# Patient Record
Sex: Male | Born: 1995 | Race: White | Hispanic: No | Marital: Single | Smoking: Never smoker
Health system: Southern US, Community
[De-identification: ages and names within clinical notes are randomized; demographics above are authoritative.]

## PROBLEM LIST (undated history)

## (undated) DIAGNOSIS — F70 Mild intellectual disabilities: Secondary | ICD-10-CM

## (undated) DIAGNOSIS — F909 Attention-deficit hyperactivity disorder, unspecified type: Secondary | ICD-10-CM

---
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
## 2016-03-17 ENCOUNTER — Ambulatory Visit (INDEPENDENT_AMBULATORY_CARE_PROVIDER_SITE_OTHER): Payer: Medicaid Other | Admitting: Family Medicine

## 2016-03-17 ENCOUNTER — Encounter: Payer: Self-pay | Admitting: Family Medicine

## 2016-03-17 VITALS — BP 124/84 | Ht 64.0 in | Wt 176.0 lb

## 2016-03-17 DIAGNOSIS — Z0000 Encounter for general adult medical examination without abnormal findings: Secondary | ICD-10-CM

## 2016-03-17 DIAGNOSIS — Q539 Undescended testicle, unspecified: Secondary | ICD-10-CM

## 2016-03-17 DIAGNOSIS — Q5310 Unspecified undescended testicle, unilateral: Secondary | ICD-10-CM

## 2016-03-17 MED ORDER — POLYETHYLENE GLYCOL 3350 17 GM/SCOOP PO POWD: ORAL | 5 refills | Status: AC

## 2016-03-17 NOTE — Progress Notes (Signed)
   Subjective:    Patient ID: Darryl Hickman, male    DOB: 1996-08-02, 20 y.o.   MRN: [id number]  HPI The patient comes in today for a wellness visit.  A review of their health history was completed.  A review of medications was also completed.  Any needed refills; none  Eating habits: eats healthy  Falls/  MVA accidents in past few months: none  Regular exercise: no regular exercise  Specialist pt sees on regular basis: none  Preventative health issues were discussed.   Additional concerns: constipation.  blood in stool one time. Stools are very hard in nature in recent months. At times sharp pain during a bowel movement slight blood at that time.  Living a group home. Accompanied by caretaker dense today    Review of Systems  Constitutional: Negative for activity change, appetite change and fever.  HENT: Negative for congestion and rhinorrhea.   Eyes: Negative for discharge.  Respiratory: Negative for cough and wheezing.   Cardiovascular: Negative for chest pain.  Gastrointestinal: Negative for abdominal pain, blood in stool and vomiting.  Genitourinary: Negative for difficulty urinating and frequency.  Musculoskeletal: Negative for neck pain.  Skin: Negative for rash.  Allergic/Immunologic: Negative for environmental allergies and food allergies.  Neurological: Negative for weakness and headaches.  Psychiatric/Behavioral: Negative for agitation.  All other systems reviewed and are negative.      Objective:   Physical Exam  Constitutional: He appears well-developed and well-nourished.  HENT:  Head: Normocephalic and atraumatic.  Right Ear: External ear normal.  Left Ear: External ear normal.  Nose: Nose normal.  Mouth/Throat: Oropharynx is clear and moist.  Eyes: EOM are normal. Pupils are equal, round, and reactive to light.  Neck: Normal range of motion. Neck supple. No thyromegaly present.  Cardiovascular: Normal rate, regular rhythm and normal heart sounds.    No murmur heard. Pulmonary/Chest: Effort normal and breath sounds normal. No respiratory distress. He has no wheezes.  Abdominal: Soft. Bowel sounds are normal. He exhibits no distension and no mass. There is no tenderness.  Genitourinary: Penis normal.  Musculoskeletal: Normal range of motion. He exhibits no edema.  Lymphadenopathy:    He has no cervical adenopathy.  Neurological: He is alert. He exhibits normal muscle tone.  Skin: Skin is warm and dry. No erythema.  Psychiatric: He has a normal mood and affect. His behavior is normal. Judgment normal.  Vitals reviewed.  Only one testicle on left side palpable.  Slight rectal fissure noted       Assessment & Plan:  Impression 1 wellness exam #2 developmental delay/low IQ #3 group home status with good support #4 constipation rectal fissures discussed #5 undescended testicle. This is a challenge. At times in the past patient was resistant and genital exams and this may be a long-term issue. Difficult to say. Plan no vaccines today. Diet exercise discussed. Add Muro lax. Urology referral rationale discussed with patient and caretaker

## 2016-03-21 ENCOUNTER — Encounter: Payer: Self-pay | Admitting: Family Medicine

## 2016-04-27 ENCOUNTER — Ambulatory Visit (INDEPENDENT_AMBULATORY_CARE_PROVIDER_SITE_OTHER): Payer: Medicaid Other | Admitting: Urology

## 2016-04-27 DIAGNOSIS — Q5310 Unspecified undescended testicle, unilateral: Secondary | ICD-10-CM

## 2016-05-03 ENCOUNTER — Other Ambulatory Visit: Payer: Self-pay | Admitting: Urology

## 2016-05-03 DIAGNOSIS — Q5310 Unspecified undescended testicle, unilateral: Secondary | ICD-10-CM

## 2016-05-06 ENCOUNTER — Encounter (HOSPITAL_COMMUNITY): Payer: Self-pay

## 2016-05-06 ENCOUNTER — Ambulatory Visit (HOSPITAL_COMMUNITY)
Admission: RE | Admit: 2016-05-06 | Discharge: 2016-05-06 | Disposition: A | Discharge status: Home / Self Care | Payer: Medicaid Other | Source: Ambulatory Visit | Attending: Urology | Admitting: Urology

## 2016-05-06 DIAGNOSIS — Q5310 Unspecified undescended testicle, unilateral: Secondary | ICD-10-CM

## 2016-05-10 ENCOUNTER — Telehealth: Payer: Self-pay | Admitting: Family Medicine

## 2016-05-10 NOTE — Telephone Encounter (Signed)
A form was dropped off to be filled out for the pt to participate in the special [place]. Form is in nurse box.

## 2016-05-25 ENCOUNTER — Ambulatory Visit (INDEPENDENT_AMBULATORY_CARE_PROVIDER_SITE_OTHER): Payer: Medicaid Other | Admitting: Urology

## 2016-05-25 DIAGNOSIS — Q5310 Unspecified undescended testicle, unilateral: Secondary | ICD-10-CM | POA: Diagnosis not present

## 2016-06-08 ENCOUNTER — Other Ambulatory Visit: Payer: Self-pay | Admitting: Radiology

## 2016-06-08 ENCOUNTER — Telehealth: Payer: Self-pay | Admitting: Radiology

## 2016-06-08 DIAGNOSIS — Q5310 Unspecified undescended testicle, unilateral: Secondary | ICD-10-CM

## 2016-06-08 NOTE — Telephone Encounter (Signed)
LMOM. Need to discuss surgery information. 

## 2016-06-13 NOTE — Telephone Encounter (Signed)
Notified pt's caregiver, Roma KayserVince Marley, of surgery scheduled with Dr Ronne BinningMcKenzie at [facility] on 06/22/16, pre-op appt at [facility] on 06/17/16 @10 :00 & post op appt with Dr Ronne BinningMcKenzie in [place] office on 07/06/16 @10 :45. Vince voices understanding.

## 2016-06-15 NOTE — Patient Instructions (Signed)
Janie MorningJoseph E Level  06/15/2016     @PREFPERIOPPHARMACY @   Your procedure is scheduled on  06/22/2016   [facility] at  1115  A.M.  Call this number if you have problems the morning of surgery:  [phone]   Remember:  Do not eat food or drink liquids after midnight.  Take these medicines the morning of surgery with A SIP OF WATER adderall, zyrtec, prednisone. Take your nebulizer before you come. Bring your inhaler with you.   Do not wear jewelry, make-up or nail polish.  Do not wear lotions, powders, or perfumes, or deoderant.  Do not shave 48 hours prior to surgery.  Men may shave face and neck.  Do not bring valuables to the hospital.  [facility] is not responsible for any belongings or valuables.  Contacts, dentures or bridgework may not be worn into surgery.  Leave your suitcase in the car.  After surgery it may be brought to your room.  For patients admitted to the hospital, discharge time will be determined by your treatment team.  Patients discharged the day of surgery will not be allowed to drive home.   Name and phone number of your driver:   family Special instructions:  none  Please read over the following fact sheets that you were given. Anesthesia Post-op Instructions and Care and Recovery After Surgery       Orchiectomy An orchiectomy is the removal of the testicles. It is most often done to treat cancer of the prostate. It is also done to treat cancer of the testicles. The testicles can be replaced with artificial testicles. LET [facility] KNOW ABOUT:  Any allergies you have.  All medicines you are taking, including vitamins, herbs, eye drops, creams, and over-the-counter medicines.  Previous problems you or members of your family have had with the use of anesthetics.  Any blood disorders you have.  Previous surgeries you have had.  Medical conditions you have. RISKS AND COMPLICATIONS Generally,  orchiectomy is a safe procedure. However, as with any procedure, problems can occur. Possible problems include:  Infection of the surgical site.  Bleeding inside the sac that holds your testicles (scrotum). This is called a scrotal hematoma.  Discharge from the surgical site. BEFORE THE PROCEDURE  You may be asked to wash your genital area with sterile soap the morning of your procedure.  You may be given an oral antibiotic medicine, which you should take with a sip of water as prescribed by your physician.  You will generally not be allowed to eat or drink anything for 8 hours prior to your surgery. Ask your health care provider if it is okay to take any needed medicines with a sip of water. PROCEDURE  This surgery is done with the use of a local anesthetic. Sometimes a general anesthetic or light sedation may be used and you may be sleeping during the procedure. If your procedure is indicated for treatment of prostate cancer, the incision will be in the scrotum. If your procedure is for testicular cancer, the incision will be in the groin.After the removal, the incision will be closed. A sterile dressing will be applied to the incision site. You may have a scrotal support. This elevates the scrotum, thereby relieving pressure on the surgical site.  AFTER THE PROCEDURE After the procedure, you will be taken to the recovery area, where a nurse will watch you and check  your progress. Once you are awake, stable, and taking fluids well, without other problems, you will be allowed to go home. In those cases where your scrotal support irritates your incision site, you may remove the support. It is okay if the dressing comes off, especially at night. Air will help a scab to form, which will eliminate the need for dressings during the day.   This information is not intended to replace advice given to you by your health care provider. Make sure you discuss any questions you have with your health care  provider.   Document Released: 07/01/2005 Document Revised: 08/06/2013 Document Reviewed: 01/02/2013 Elsevier Interactive Patient Education 2016 Elsevier [employer] Orchiectomy, Care After Refer to this sheet in the next few weeks. These instructions provide you with information on caring for yourself after your procedure. Your health care provider may also give you more specific instructions. Your treatment has been planned according to current medical practices, but problems sometimes occur. Call your health care provider if you have any problems or questions after your procedure. WHAT TO EXPECT AFTER THE PROCEDURE A sterile dressing will be applied to the incision site. You may have a scrotal support. This elevates the scrotum, thereby relieving pressure on the surgical site. In those cases where the scrotal support irritates the incision site, you may be better with the support removed. It is okay if the dressing comes off, especially at night. Air will help a scab to form, which will eliminate the need for dressings during the day. HOME CARE INSTRUCTIONS  Your sterile dressing may be changed once per day or as instructed by your health care provider. If the dressing sticks to your incision site, you may use warm, soapy water or hydrogen peroxide to dampen the bandage. This will loosen the bandage from your skin so that it may be removed.  You may take showers the day after your procedure. Let the water pass gently over the surgery site. Do not rub the site. Pat the area gently or allow to air dry.  Avoid activities that may cause your incision to open.  Do not engage in sexual activity until the area is healed. Usually this will be in about 10-14 days.  Only take over-the-counter or prescription medicines for pain, discomfort, or fever as directed by your health care provider. SEEK MEDICAL CARE IF:  You experience increasing pain. SEEK IMMEDIATE MEDICAL CARE IF:  You have persistent dizziness  or feel sick to your stomach (nausea).  You have difficulty staying awake or are unable to wake from sleeping.  You have difficulty breathing or a congested cough.  You have a fever or shaking chills.  You have increasing pain or tenderness at the incision site.  You notice pus coming from the incision.  You notice a bad smell coming from the incision or dressing.  You cannot eat or drink or you develop nausea or vomiting.  You have constipation that is not helped by adjusting your diet or increasing fluid intake. Pain medicines are a common cause of constipation.  Your incision breaks open after the sutures have been removed.  You experience pain, swelling, or redness in your genital or groin area.   This information is not intended to replace advice given to you by your health care provider. Make sure you discuss any questions you have with your health care provider.   Document Released: 04/03/2013 Document Revised: 08/06/2013 Document Reviewed: 04/03/2013 Elsevier Interactive Patient Education 2016 Elsevier Inc. General Anesthesia, Adult General anesthesia  is a sleep-like state of non-feeling produced by medicines (anesthetics). General anesthesia prevents you from being alert and feeling pain during a medical procedure. Your caregiver may recommend general anesthesia if your procedure:  Is long.  Is painful or uncomfortable.  Would be frightening to see or hear.  Requires you to be still.  Affects your breathing.  Causes significant blood loss. LET YOUR CAREGIVER KNOW ABOUT:  Allergies to food or medicine.  Medicines taken, including vitamins, herbs, eyedrops, over-the-counter medicines, and creams.  Use of steroids (by mouth or creams).  Previous problems with anesthetics or numbing medicines, including problems experienced by relatives.  History of bleeding problems or blood clots.  Previous surgeries and types of anesthetics received.  Possibility of  pregnancy, if this applies.  Use of cigarettes, alcohol, or illegal drugs.  Any health condition(s), especially diabetes, sleep apnea, and high blood pressure. RISKS AND COMPLICATIONS General anesthesia rarely causes complications. However, if complications do occur, they can be life threatening. Complications include:  A lung infection.  A stroke.  A heart attack.  Waking up during the procedure. When this occurs, the patient may be unable to move and communicate that he or she is awake. The patient may feel severe pain. Older adults and adults with serious medical problems are more likely to have complications than adults who are young and healthy. Some complications can be prevented by answering all of your caregiver's questions thoroughly and by following all pre-procedure instructions. It is important to tell your caregiver if any of the pre-procedure instructions, especially those related to diet, were not followed. Any food or liquid in the stomach can cause problems when you are under general anesthesia. BEFORE THE PROCEDURE  Ask your caregiver if you will have to spend the night at the hospital. If you will not have to spend the night, arrange to have an adult drive you and stay with you for 24 hours.  Follow your caregiver's instructions if you are taking dietary supplements or medicines. Your caregiver may tell you to stop taking them or to reduce your dosage.  Do not smoke for as long as possible before your procedure. If possible, stop smoking 3-6 weeks before the procedure.  Do not take new dietary supplements or medicines within 1 week of your procedure unless your caregiver approves them.  Do not eat within 8 hours of your procedure or as directed by your caregiver. Drink only clear liquids, such as water, black coffee (without milk or cream), and fruit juices (without pulp).  Do not drink within 3 hours of your procedure or as directed by your caregiver.  You may brush  your teeth on the morning of the procedure, but make sure to spit out the toothpaste and water when finished. PROCEDURE  You will receive anesthetics through a mask, through an intravenous (IV) access tube, or through both. A doctor who specializes in anesthesia (anesthesiologist) or a nurse who specializes in anesthesia (nurse anesthetist) or both will stay with you throughout the procedure to make sure you remain unconscious. He or she will also watch your blood pressure, pulse, and oxygen levels to make sure that the anesthetics do not cause any problems. Once you are asleep, a breathing tube or mask may be used to help you breathe. AFTER THE PROCEDURE You will wake up after the procedure is complete. You may be in the room where the procedure was performed or in a recovery area. You may have a sore throat if a breathing tube  was used. You may also feel:  Dizzy.  Weak.  Drowsy.  Confused.  Nauseous.  Cold. These are all normal responses and can be expected to last for up to 24 hours after the procedure is complete. A caregiver will tell you when you are ready to go home. This will usually be when you are fully awake and in stable condition.   This information is not intended to replace advice given to you by your health care provider. Make sure you discuss any questions you have with your health care provider.   Document Released: 11/08/2007 Document Revised: 08/22/2014 Document Reviewed: 11/30/2011 Elsevier Interactive Patient Education 2016 Elsevier Inc. General Anesthesia, Adult, Care After Refer to this sheet in the next few weeks. These instructions provide you with information on caring for yourself after your procedure. Your health care provider may also give you more specific instructions. Your treatment has been planned according to current medical practices, but problems sometimes occur. Call your health care provider if you have any problems or questions after your  procedure. WHAT TO EXPECT AFTER THE PROCEDURE After the procedure, it is typical to experience:  Sleepiness.  Nausea and vomiting. HOME CARE INSTRUCTIONS  For the first 24 hours after general anesthesia:  Have a responsible person with you.  Do not drive a car. If you are alone, do not take public transportation.  Do not drink alcohol.  Do not take medicine that has not been prescribed by your health care provider.  Do not sign important papers or make important decisions.  You may resume a normal diet and activities as directed by your health care provider.  Change bandages (dressings) as directed.  If you have questions or problems that seem related to general anesthesia, call the hospital and ask for the anesthetist or anesthesiologist on call. SEEK MEDICAL CARE IF:  You have nausea and vomiting that continue the day after anesthesia.  You develop a rash. SEEK IMMEDIATE MEDICAL CARE IF:   You have difficulty breathing.  You have chest pain.  You have any allergic problems.   This information is not intended to replace advice given to you by your health care provider. Make sure you discuss any questions you have with your health care provider.   Document Released: 11/07/2000 Document Revised: 08/22/2014 Document Reviewed: 11/30/2011 Elsevier Interactive Patient Education 2016 Elsevier Inc. PATIENT INSTRUCTIONS POST-ANESTHESIA  IMMEDIATELY FOLLOWING SURGERY:  Do not drive or operate machinery for the first twenty four hours after surgery.  Do not make any important decisions for twenty four hours after surgery or while taking narcotic pain medications or sedatives.  If you develop intractable nausea and vomiting or a severe headache please notify your doctor immediately.  FOLLOW-UP:  Please make an appointment with your surgeon as instructed. You do not need to follow up with anesthesia unless specifically instructed to do so.  WOUND CARE INSTRUCTIONS (if  applicable):  Keep a dry clean dressing on the anesthesia/puncture wound site if there is drainage.  Once the wound has quit draining you may leave it open to air.  Generally you should leave the bandage intact for twenty four hours unless there is drainage.  If the epidural site drains for more than 36-48 hours please call the anesthesia department.  QUESTIONS?:  Please feel free to call your physician or the hospital operator if you have any questions, and they will be happy to assist you.

## 2016-06-17 ENCOUNTER — Encounter (HOSPITAL_COMMUNITY)
Admission: RE | Admit: 2016-06-17 | Discharge: 2016-06-17 | Disposition: A | Discharge status: Home / Self Care | Payer: Medicaid Other | Source: Ambulatory Visit | Attending: Urology | Admitting: Urology

## 2016-06-17 ENCOUNTER — Encounter (HOSPITAL_COMMUNITY): Payer: Self-pay

## 2016-06-17 DIAGNOSIS — Z01818 Encounter for other preprocedural examination: Secondary | ICD-10-CM | POA: Diagnosis present

## 2016-06-17 HISTORY — DX: Attention-deficit hyperactivity disorder, unspecified type: F90.9

## 2016-06-17 HISTORY — DX: Mild intellectual disabilities: F70

## 2016-06-17 LAB — CBC WITH DIFFERENTIAL/PLATELET
BASOS PCT: 1 %
Basophils Absolute: 0.1 10*3/uL (ref 0.0–0.1)
EOS ABS: 0.2 10*3/uL (ref 0.0–0.7)
Eosinophils Relative: 2 %
HEMATOCRIT: 43.7 % (ref 39.0–52.0)
Hemoglobin: 14.3 g/dL (ref 13.0–17.0)
Lymphocytes Relative: 30 %
Lymphs Abs: 2.4 10*3/uL (ref 0.7–4.0)
MCH: 28.8 pg (ref 26.0–34.0)
MCHC: 32.7 g/dL (ref 30.0–36.0)
MCV: 87.9 fL (ref 78.0–100.0)
MONO ABS: 0.7 10*3/uL (ref 0.1–1.0)
MONOS PCT: 9 %
Neutro Abs: 4.5 10*3/uL (ref 1.7–7.7)
Neutrophils Relative %: 58 %
Platelets: 186 10*3/uL (ref 150–400)
RBC: 4.97 MIL/uL (ref 4.22–5.81)
RDW: 13.1 % (ref 11.5–15.5)
WBC: 7.8 10*3/uL (ref 4.0–10.5)

## 2016-06-22 ENCOUNTER — Encounter (HOSPITAL_COMMUNITY): Payer: Self-pay | Admitting: *Deleted

## 2016-06-22 ENCOUNTER — Encounter (HOSPITAL_COMMUNITY)
Admission: RE | Disposition: A | Discharge status: Home / Self Care | Payer: Self-pay | Source: Ambulatory Visit | Attending: Urology

## 2016-06-22 ENCOUNTER — Ambulatory Visit (HOSPITAL_COMMUNITY): Payer: Medicaid Other | Admitting: Anesthesiology

## 2016-06-22 ENCOUNTER — Ambulatory Visit (HOSPITAL_COMMUNITY)
Admission: RE | Admit: 2016-06-22 | Discharge: 2016-06-22 | Disposition: A | Discharge status: Home / Self Care | Payer: Medicaid Other | Source: Ambulatory Visit | Attending: Urology | Admitting: Urology

## 2016-06-22 DIAGNOSIS — F909 Attention-deficit hyperactivity disorder, unspecified type: Secondary | ICD-10-CM | POA: Insufficient documentation

## 2016-06-22 DIAGNOSIS — Q5310 Unspecified undescended testicle, unilateral: Secondary | ICD-10-CM

## 2016-06-22 DIAGNOSIS — F70 Mild intellectual disabilities: Secondary | ICD-10-CM | POA: Diagnosis not present

## 2016-06-22 DIAGNOSIS — J45909 Unspecified asthma, uncomplicated: Secondary | ICD-10-CM | POA: Diagnosis not present

## 2016-06-22 DIAGNOSIS — Z79899 Other long term (current) drug therapy: Secondary | ICD-10-CM | POA: Diagnosis not present

## 2016-06-22 HISTORY — PX: ORCHIECTOMY: SHX2116

## 2016-06-22 LAB — GLUCOSE, CAPILLARY
GLUCOSE-CAPILLARY: 83 mg/dL (ref 65–99)
Glucose-Capillary: 63 mg/dL — ABNORMAL LOW (ref 65–99)

## 2016-06-22 SURGERY — ORCHIECTOMY
Anesthesia: General | Laterality: Right

## 2016-06-22 MED ORDER — ONDANSETRON HCL 4 MG/2ML IJ SOLN
4.0000 mg | Freq: Once | INTRAMUSCULAR | Status: AC
  Administered 2016-06-22: 4 mg via INTRAVENOUS

## 2016-06-22 MED ORDER — LIDOCAINE HCL (PF) 1 % IJ SOLN
INTRAMUSCULAR | Status: AC
  Filled 2016-06-22: qty 5

## 2016-06-22 MED ORDER — HYDROCODONE-ACETAMINOPHEN 5-325 MG PO TABS
ORAL_TABLET | ORAL | Status: AC
  Filled 2016-06-22: qty 1

## 2016-06-22 MED ORDER — FENTANYL CITRATE (PF) 100 MCG/2ML IJ SOLN
INTRAMUSCULAR | Status: AC
  Filled 2016-06-22: qty 2

## 2016-06-22 MED ORDER — FENTANYL CITRATE (PF) 250 MCG/5ML IJ SOLN
INTRAMUSCULAR | Status: AC
  Filled 2016-06-22: qty 5

## 2016-06-22 MED ORDER — PROPOFOL 10 MG/ML IV BOLUS
INTRAVENOUS | Status: DC | PRN
  Administered 2016-06-22: 140 mg via INTRAVENOUS

## 2016-06-22 MED ORDER — LIDOCAINE HCL 1 % IJ SOLN
INTRAMUSCULAR | Status: DC | PRN
  Administered 2016-06-22: 25 mg via INTRADERMAL

## 2016-06-22 MED ORDER — CEFAZOLIN IN D5W 1 GM/50ML IV SOLN
INTRAVENOUS | Status: AC
Start: 2016-06-22 — End: 2016-06-22
  Filled 2016-06-22: qty 50

## 2016-06-22 MED ORDER — FENTANYL CITRATE (PF) 100 MCG/2ML IJ SOLN
INTRAMUSCULAR | Status: DC | PRN
  Administered 2016-06-22 (×5): 25 ug via INTRAVENOUS
  Administered 2016-06-22: 50 ug via INTRAVENOUS
  Administered 2016-06-22: 25 ug via INTRAVENOUS

## 2016-06-22 MED ORDER — BUPIVACAINE HCL (PF) 0.25 % IJ SOLN
INTRAMUSCULAR | Status: AC
Start: 2016-06-22 — End: 2016-06-22
  Filled 2016-06-22: qty 30

## 2016-06-22 MED ORDER — CEFAZOLIN IN D5W 1 GM/50ML IV SOLN
1.0000 g | INTRAVENOUS | Status: AC
  Administered 2016-06-22: 1 g via INTRAVENOUS

## 2016-06-22 MED ORDER — FENTANYL CITRATE (PF) 100 MCG/2ML IJ SOLN
25.0000 ug | INTRAMUSCULAR | Status: DC | PRN
  Administered 2016-06-22 (×2): 50 ug via INTRAVENOUS

## 2016-06-22 MED ORDER — LACTATED RINGERS IV SOLN
INTRAVENOUS | Status: DC
  Administered 2016-06-22: 12:00:00 via INTRAVENOUS

## 2016-06-22 MED ORDER — ONDANSETRON HCL 4 MG/2ML IJ SOLN
INTRAMUSCULAR | Status: AC
  Filled 2016-06-22: qty 2

## 2016-06-22 MED ORDER — MIDAZOLAM HCL 5 MG/5ML IJ SOLN
INTRAMUSCULAR | Status: DC | PRN
  Administered 2016-06-22: 2 mg via INTRAVENOUS

## 2016-06-22 MED ORDER — MIDAZOLAM HCL 2 MG/2ML IJ SOLN
1.0000 mg | INTRAMUSCULAR | Status: DC | PRN
  Administered 2016-06-22: 2 mg via INTRAVENOUS

## 2016-06-22 MED ORDER — PROPOFOL 10 MG/ML IV BOLUS
INTRAVENOUS | Status: AC
  Filled 2016-06-22: qty 20

## 2016-06-22 MED ORDER — DEXTROSE 50 % IV SOLN
INTRAVENOUS | Status: AC
  Filled 2016-06-22: qty 50

## 2016-06-22 MED ORDER — DEXTROSE 50 % IV SOLN
25.0000 mL | Freq: Once | INTRAVENOUS | Status: AC
  Administered 2016-06-22: 25 mL via INTRAVENOUS

## 2016-06-22 MED ORDER — HYDROCODONE-ACETAMINOPHEN 5-325 MG PO TABS: 1.0000 | ORAL_TABLET | Freq: Four times a day (QID) | ORAL | 0 refills | Status: DC | PRN

## 2016-06-22 MED ORDER — BUPIVACAINE HCL (PF) 0.25 % IJ SOLN
INTRAMUSCULAR | Status: DC | PRN
  Administered 2016-06-22: 10 mL

## 2016-06-22 MED ORDER — 0.9 % SODIUM CHLORIDE (POUR BTL) OPTIME
TOPICAL | Status: DC | PRN
  Administered 2016-06-22: 1000 mL

## 2016-06-22 MED ORDER — MIDAZOLAM HCL 2 MG/2ML IJ SOLN
INTRAMUSCULAR | Status: AC
  Filled 2016-06-22: qty 2

## 2016-06-22 MED ORDER — HYDROCODONE-ACETAMINOPHEN 5-325 MG PO TABS
1.0000 | ORAL_TABLET | Freq: Once | ORAL | Status: AC
  Administered 2016-06-22: 1 via ORAL

## 2016-06-22 MED ORDER — MIDAZOLAM HCL 2 MG/2ML IJ SOLN
INTRAMUSCULAR | Status: AC
Start: 2016-06-22 — End: 2016-06-22
  Filled 2016-06-22: qty 2

## 2016-06-22 MED ORDER — PROPOFOL 10 MG/ML IV BOLUS
INTRAVENOUS | Status: AC
  Filled 2016-06-22: qty 40

## 2016-06-22 SURGICAL SUPPLY — 52 items
ADH SKN CLS APL DERMABOND .7 (GAUZE/BANDAGES/DRESSINGS) ×1
APPLICATOR COTTON TIP 6IN STRL (MISCELLANEOUS) IMPLANT
BAG HAMPER (MISCELLANEOUS) ×3 IMPLANT
COVER LIGHT HANDLE STERIS (MISCELLANEOUS) ×6 IMPLANT
DERMABOND ADVANCED (GAUZE/BANDAGES/DRESSINGS) ×2
DERMABOND ADVANCED .7 DNX12 (GAUZE/BANDAGES/DRESSINGS) IMPLANT
DRAIN PENROSE 18X1/2 LTX STRL (DRAIN) ×3 IMPLANT
ELECT NDL TIP 2.8 STRL (NEEDLE) IMPLANT
ELECT NEEDLE TIP 2.8 STRL (NEEDLE) IMPLANT
ELECT REM PT RETURN 9FT ADLT (ELECTROSURGICAL) ×3
ELECTRODE REM PT RTRN 9FT ADLT (ELECTROSURGICAL) ×1 IMPLANT
GLOVE BIO SURGEON STRL SZ8 (GLOVE) ×3 IMPLANT
GLOVE BIOGEL PI IND STRL 8.5 (GLOVE) ×1 IMPLANT
GLOVE BIOGEL PI INDICATOR 8.5 (GLOVE) ×2
GLOVE ECLIPSE 6.5 STRL STRAW (GLOVE) ×2 IMPLANT
GLOVE EXAM NITRILE PF MED BLUE (GLOVE) ×2 IMPLANT
GOWN STRL REUS W/ TWL LRG LVL3 (GOWN DISPOSABLE) ×1 IMPLANT
GOWN STRL REUS W/ TWL XL LVL3 (GOWN DISPOSABLE) ×1 IMPLANT
GOWN STRL REUS W/TWL LRG LVL3 (GOWN DISPOSABLE) ×3
GOWN STRL REUS W/TWL XL LVL3 (GOWN DISPOSABLE) ×3
KIT ROOM TURNOVER APOR (KITS) ×3 IMPLANT
LIQUID BAND (GAUZE/BANDAGES/DRESSINGS) IMPLANT
MANIFOLD NEPTUNE II (INSTRUMENTS) ×2 IMPLANT
NDL HYPO 25X1 1.5 SAFETY (NEEDLE) ×1 IMPLANT
NEEDLE HYPO 25X1 1.5 SAFETY (NEEDLE) ×3 IMPLANT
NS IRRIG 500ML POUR BTL (IV SOLUTION) IMPLANT
PACK MINOR (CUSTOM PROCEDURE TRAY) ×3 IMPLANT
PAD ARMBOARD 7.5X6 YLW CONV (MISCELLANEOUS) ×3 IMPLANT
SET BASIN LINEN APH (SET/KITS/TRAYS/PACK) ×3 IMPLANT
SUPPORT SCROTAL LG STRP (MISCELLANEOUS) ×2 IMPLANT
SUPPORTER ATHLETIC LG (MISCELLANEOUS) ×1
SUT CHROMIC 3 0 SH 27 (SUTURE) IMPLANT
SUT CHROMIC 4 0 PS 2 18 (SUTURE) IMPLANT
SUT CHROMIC GUT AB #0 18 (SUTURE) IMPLANT
SUT MNCRL AB 4-0 PS2 18 (SUTURE) ×2 IMPLANT
SUT PROLENE 3 0 PS 2 (SUTURE) ×2 IMPLANT
SUT PROLENE 4 0 RB 1 (SUTURE)
SUT PROLENE 4-0 RB1 .5 CRCL 36 (SUTURE) IMPLANT
SUT SILK 0 SH 30 (SUTURE) IMPLANT
SUT SILK 2 0 (SUTURE) ×3
SUT SILK 2 0 SH (SUTURE) ×2 IMPLANT
SUT SILK 2-0 18XBRD TIE 12 (SUTURE) IMPLANT
SUT VIC AB 0 SH 27 (SUTURE) IMPLANT
SUT VIC AB 2-0 SH 27 (SUTURE) ×3
SUT VIC AB 2-0 SH 27X BRD (SUTURE) ×1 IMPLANT
SUT VIC AB 2-0 SH 27XBRD (SUTURE) IMPLANT
SUT VIC AB 3-0 SH 27 (SUTURE) ×3
SUT VIC AB 3-0 SH 27X BRD (SUTURE) IMPLANT
SUT VICRYL 2 0 18  UND BR (SUTURE)
SUT VICRYL 2 0 18 UND BR (SUTURE) IMPLANT
SYR 30ML LL (SYRINGE) IMPLANT
SYR CONTROL 10ML LL (SYRINGE) ×3 IMPLANT

## 2016-06-22 NOTE — H&P (Signed)
Urology Admission H&P  Chief Complaint: right undescended tesis  History of Present Illness: Mr Darryl Hickman is a 20yo with a right atrophic undescended testis.   Past Medical History:  Diagnosis Date  . ADHD   . Asthma   . Mental retardation, mild (I.Q. 50-70)    History reviewed. No pertinent surgical history.  Home Medications:  Prescriptions Prior to Admission  Medication Sig Dispense Refill Last Dose  . albuterol (PROVENTIL HFA;VENTOLIN HFA) 108 (90 BASE) MCG/ACT inhaler Inhale 2 puffs into the lungs every 4 (four) hours as needed for wheezing or shortness of breath. 1 Inhaler 0   . albuterol (PROVENTIL) (2.5 MG/3ML) 0.083% nebulizer solution Take 2.5 mg by nebulization every 6 (six) hours as needed.     Taking  . Amphetamine-Dextroamphetamine (ADDERALL PO) Take by mouth 2 (two) times daily. I tablet Twice daily. 10 mg in the A.M. &  10 mg @@ Noon     . cetirizine (ZYRTEC) 10 MG tablet Take 1 tablet (10 mg total) by mouth daily. 30 tablet 11 Taking  . polyethylene glycol powder (GLYCOLAX/MIRALAX) powder One scoop daily 3350 g 5   . predniSONE (DELTASONE) 20 MG tablet Three qd for three d two qd for three d one qd for two d 17 tablet 0   . triamcinolone cream (KENALOG) 0.1 % Apply 1 application topically 2 (two) times daily. 60 g 0 Taking  . triamcinolone cream (KENALOG) 0.1 % Apply 1 application topically 2 (two) times daily. 60 g 1    Allergies:  Allergies  Allergen Reactions  . Sulfa Antibiotics   . Dimetapp Cold-Allergy [Brompheniramine-Phenylephrine] Itching    Family History  Problem Relation Age of Onset  . Anemia Mother   . Diabetes Mother    Social History:  reports that he has never smoked. He has never used smokeless tobacco. He reports that he does not drink alcohol or use drugs.  Review of Systems  All other systems reviewed and are negative.   Physical Exam:  Vital signs in last 24 hours: Temp:  [98.3 F (36.8 C)] 98.3 F (36.8 C) (11/08 1210) Resp:   [16-36] 18 (11/08 1225) BP: (118-125)/(66-70) 125/67 (11/08 1225) SpO2:  [98 %-100 %] 98 % (11/08 1225) Physical Exam  Constitutional: He is oriented to person, place, and time. He appears well-developed and well-nourished.  HENT:  Head: Normocephalic and atraumatic.  Eyes: EOM are normal. Pupils are equal, round, and reactive to light.  Neck: Normal range of motion. No thyromegaly present.  Cardiovascular: Normal rate and regular rhythm.   Respiratory: Effort normal. No respiratory distress.  GI: Soft. He exhibits no distension. Hernia confirmed negative in the right inguinal area and confirmed negative in the left inguinal area.  Genitourinary: Penis normal. Right testis is undescended. Left testis shows no mass, no swelling and no tenderness. Left testis is descended. Cremasteric reflex is not absent on the left side.  Musculoskeletal: Normal range of motion.  Lymphadenopathy:       Right: No inguinal adenopathy present.       Left: No inguinal adenopathy present.  Neurological: He is alert and oriented to person, place, and time.  Skin: Skin is warm and dry.  Psychiatric: He has a normal mood and affect. His behavior is normal. Judgment and thought content normal.    Laboratory Data:  Results for orders placed or performed during the hospital encounter of 06/22/16 (from the past 24 hour(s))  Glucose, capillary     Status: Abnormal   Collection Time:  06/22/16 11:49 AM  Result Value Ref Range   Glucose-Capillary 63 (L) 65 - 99 mg/dL   No results found for this or any previous visit (from the past 240 hour(s)). Creatinine: No results for input(s): CREATININE in the last 168 hours. Baseline Creatinine: unknown  Impression/Assessment:  20yo with right undescended testis  Plan:  The risks/benefits/alternatives to right inguinal orchiectomy was explained to the parents and they understand and wish to proceed with surgery  Wilkie Ayeatrick Aydrien Froman 06/22/2016, 12:53 PM

## 2016-06-22 NOTE — Anesthesia Preprocedure Evaluation (Signed)
Anesthesia Evaluation  Patient identified by MRN, date of birth, ID band Patient awake    Reviewed: Allergy & Precautions, NPO status , Patient's Chart, lab work & pertinent test results  Airway Mallampati: III  TM Distance: >3 FB Neck ROM: Limited    Dental  (+) Teeth Intact   Pulmonary asthma ,    breath sounds clear to auscultation       Cardiovascular negative cardio ROS   Rhythm:Regular Rate:Normal     Neuro/Psych PSYCHIATRIC DISORDERS (ADHD, mild mental retardation)    GI/Hepatic negative GI ROS,   Endo/Other    Renal/GU      Musculoskeletal   Abdominal   Peds  Hematology   Anesthesia Other Findings   Reproductive/Obstetrics                             Anesthesia Physical Anesthesia Plan  ASA: II  Anesthesia Plan: General   Post-op Pain Management:    Induction: Intravenous  Airway Management Planned: LMA  Additional Equipment:   Intra-op Plan:   Post-operative Plan: Extubation in OR  Informed Consent: I have reviewed the patients History and Physical, chart, labs and discussed the procedure including the risks, benefits and alternatives for the proposed anesthesia with the patient or authorized representative who has indicated his/her understanding and acceptance.     Plan Discussed with:   Anesthesia Plan Comments:         Anesthesia Quick Evaluation

## 2016-06-22 NOTE — Anesthesia Postprocedure Evaluation (Signed)
Anesthesia Post Note  Patient: Darryl Hickman  Procedure(s) Performed: Procedure(s) (LRB): INGUINAL ORCHIECTOMY (Right)  Patient location during evaluation: PACU Anesthesia Type: General Level of consciousness: awake, oriented and patient cooperative Pain management: pain level controlled Vital Signs Assessment: post-procedure vital signs reviewed and stable Respiratory status: spontaneous breathing, nonlabored ventilation and respiratory function stable Cardiovascular status: blood pressure returned to baseline and stable Postop Assessment: no signs of nausea or vomiting Anesthetic complications: no    Last Vitals:  Vitals:   06/22/16 1220 06/22/16 1225  BP: 118/66 125/67  Resp: 16 18  Temp:      Last Pain: There were no vitals filed for this visit.               Andrew Soria J

## 2016-06-22 NOTE — Transfer of Care (Signed)
Immediate Anesthesia Transfer of Care Note  Patient: Darryl Hickman  Procedure(s) Performed: Procedure(s): INGUINAL ORCHIECTOMY (Right)  Patient Location: PACU  Anesthesia Type:General  Level of Consciousness: awake and patient cooperative  Airway & Oxygen Therapy: Patient Spontanous Breathing and Patient connected to face mask oxygen  Post-op Assessment: Report given to RN, Post -op Vital signs reviewed and stable and Patient moving all extremities  Post vital signs: Reviewed and stable  Last Vitals:  Vitals:   06/22/16 1220 06/22/16 1225  BP: 118/66 125/67  Resp: 16 18  Temp:      Last Pain: There were no vitals filed for this visit.       Complications: No apparent anesthesia complications

## 2016-06-22 NOTE — Anesthesia Procedure Notes (Signed)
Procedure Name: LMA Insertion Date/Time: 06/22/2016 1:10 PM Performed by: Despina HiddenIDACAVAGE, Reannah Totten J Pre-anesthesia Checklist: Patient identified, Patient being monitored, Emergency Drugs available, Timeout performed and Suction available Patient Re-evaluated:Patient Re-evaluated prior to inductionOxygen Delivery Method: Circle System Utilized Preoxygenation: Pre-oxygenation with 100% oxygen Intubation Type: IV induction Ventilation: Mask ventilation without difficulty LMA: LMA inserted LMA Size: 4.0 Number of attempts: 1 Placement Confirmation: positive ETCO2 and breath sounds checked- equal and bilateral Tube secured with: Tape Dental Injury: Teeth and Oropharynx as per pre-operative assessment

## 2016-06-22 NOTE — Discharge Instructions (Signed)
Orchiectomy, Care After °Refer to this sheet in the next few weeks. These instructions provide you with information on caring for yourself after your procedure. Your health care provider may also give you more specific instructions. Your treatment has been planned according to current medical practices, but problems sometimes occur. Call your health care provider if you have any problems or questions after your procedure. °WHAT TO EXPECT AFTER THE PROCEDURE °A sterile dressing will be applied to the incision site. You may have a scrotal support. This elevates the scrotum, thereby relieving pressure on the surgical site. In those cases where the scrotal support irritates the incision site, you may be better with the support removed. It is okay if the dressing comes off, especially at night. Air will help a scab to form, which will eliminate the need for dressings during the day. °HOME CARE INSTRUCTIONS °· Your sterile dressing may be changed once per day or as instructed by your health care provider. If the dressing sticks to your incision site, you may use warm, soapy water or hydrogen peroxide to dampen the bandage. This will loosen the bandage from your skin so that it may be removed. °· You may take showers the day after your procedure. Let the water pass gently over the surgery site. Do not rub the site. Pat the area gently or allow to air dry. °· Avoid activities that may cause your incision to open. °· Do not engage in sexual activity until the area is healed. Usually this will be in about 10-14 days. °· Only take over-the-counter or prescription medicines for pain, discomfort, or fever as directed by your health care provider. °SEEK MEDICAL CARE IF: °· You experience increasing pain. °SEEK IMMEDIATE MEDICAL CARE IF: °· You have persistent dizziness or feel sick to your stomach (nausea). °· You have difficulty staying awake or are unable to wake from sleeping. °· You have difficulty breathing or a congested  cough. °· You have a fever or shaking chills. °· You have increasing pain or tenderness at the incision site. °· You notice pus coming from the incision. °· You notice a bad smell coming from the incision or dressing. °· You cannot eat or drink or you develop nausea or vomiting. °· You have constipation that is not helped by adjusting your diet or increasing fluid intake. Pain medicines are a common cause of constipation. °· Your incision breaks open after the sutures have been removed. °· You experience pain, swelling, or redness in your genital or groin area. °  °This information is not intended to replace advice given to you by your health care provider. Make sure you discuss any questions you have with your health care provider. °  °Document Released: 04/03/2013 Document Revised: 08/06/2013 Document Reviewed: 04/03/2013 °Elsevier Interactive Patient Education ©2016 [employer] ° °

## 2016-06-22 NOTE — Brief Op Note (Signed)
06/22/2016  2:18 PM  PATIENT:  Janie MorningJoseph E Mancia  20 y.o. male  PRE-OPERATIVE DIAGNOSIS:  right undecended testicle  POST-OPERATIVE DIAGNOSIS:  right undecended testicle  PROCEDURE:  Procedure(s): INGUINAL ORCHIECTOMY (Right)  SURGEON:  Surgeon(s) and Role:    * Malen GauzePatrick L Martiza Speth, MD - Primary  PHYSICIAN ASSISTANT:   ASSISTANTS: none   ANESTHESIA:   general  EBL:  Total I/O In: 800 [I.V.:800] Out: 5 [Blood:5]  BLOOD ADMINISTERED:none  DRAINS: none   LOCAL MEDICATIONS USED:  MARCAINE     SPECIMEN:  Source of Specimen:  right testis  DISPOSITION OF SPECIMEN:  PATHOLOGY  COUNTS:  YES  TOURNIQUET:  * No tourniquets in log *  DICTATION: .Note written in EPIC  PLAN OF CARE: Discharge to home after PACU  PATIENT DISPOSITION:  PACU - hemodynamically stable.   Delay start of Pharmacological VTE agent (>24hrs) due to surgical blood loss or risk of bleeding: not applicable

## 2016-06-22 NOTE — OR Nursing (Signed)
Rt groin surgical site clean and dry. No drainage noted. Patient sore. Pain l-2 on FACES scale. Red macular rash over chest and upper arms noted from PACU. PACU reports this being present and physicians aware. Patient smiling and laughing.Parents at bedside. DC to Caregiver [employer].

## 2016-06-23 ENCOUNTER — Encounter (HOSPITAL_COMMUNITY): Payer: Self-pay | Admitting: Urology

## 2016-06-23 NOTE — Op Note (Signed)
Preoperative diagnosis: right undescended testis.  Postoperative diagnosis: Same  Procedure: right inguinal orchiectomy  Attending: Wilkie AyePatrick Jayan Raymundo, MD  Anesthesia: General  History of blood loss: Minimal  Antibiotics: Ancef  Drains: none  Specimens: right  orchiectomy  Findings: removal of inguinal testis intact. No scrotal wall violation  Indications: Patient is a 20 year old male with a history of right undescended testis.  We discussed the treatment options including observation versus orchiectomy after discussing treatment options he and his guardian decided to proceed with orchiectomy.   Procedure in detail: Prior to procedure consent was obtained.  Patient was brought to the operating room and a brief timeout was done to ensure correct patient, correct procedure, correct site.  General anesthesia was administered and patient was placed in supine position.  His genitalia and abdomen was then prepped and draped in usual sterile fashion.  A 5 cm incision was made in the inguinal fold.  We dissected down through the subcutaneous tissue to until we reached the spermatic cord.  The spermatic cord was identified and a Penrose drain was looped around the cord.  We then proceeded to bluntly and sharply dissected the attachments of the testicle to the scrotum.  Once this was done the testicle was then brought into the operative field.  We then proceeded to dissected the gubernaculum with electrocautery.  Once the testicle was freed from its attachments were then turned our attention the spermatic cord.  We separated the spermatic cord into 3 distinct packets.  The packets were then ligated with 0 silk ties.  Once this was done we then sharply cut the spermatic cord and the spot testicle was then sent for pathology.  We then inspected the scrotum in the operative bed and we noted no residual bleeding.   We then closed the subcutaneous tissues in 2 layers with 2-0 Vicryl in a running fashion.  We  then loosely closed the skin with 4-0 Monocryl in a running fashion.  A dressing was then applied to the incision and to the Penrose drain.  We then placed a scrotal fluff and this then concluded the procedure which was well tolerated by the patient.  Complications: None  Condition: Stable, extubated, transferred to PACU.  Plan: Patient is to be discharged home.  He is to follow up in 2 weeks for wound checkl.

## 2016-07-06 ENCOUNTER — Ambulatory Visit (INDEPENDENT_AMBULATORY_CARE_PROVIDER_SITE_OTHER): Payer: Self-pay | Admitting: Urology

## 2016-07-06 DIAGNOSIS — Z9889 Other specified postprocedural states: Secondary | ICD-10-CM

## 2016-07-19 ENCOUNTER — Ambulatory Visit (INDEPENDENT_AMBULATORY_CARE_PROVIDER_SITE_OTHER): Payer: Self-pay | Admitting: Urology

## 2016-07-19 DIAGNOSIS — Z9889 Other specified postprocedural states: Secondary | ICD-10-CM

## 2016-08-03 ENCOUNTER — Ambulatory Visit (INDEPENDENT_AMBULATORY_CARE_PROVIDER_SITE_OTHER): Payer: Self-pay | Admitting: Urology

## 2016-08-03 DIAGNOSIS — Z9889 Other specified postprocedural states: Secondary | ICD-10-CM

## 2016-08-17 ENCOUNTER — Ambulatory Visit: Payer: Self-pay | Admitting: Urology

## 2016-08-30 ENCOUNTER — Telehealth: Payer: Self-pay | Admitting: Family Medicine

## 2016-08-30 MED ORDER — AMPHETAMINE-DEXTROAMPHETAMINE 20 MG PO TABS: ORAL_TABLET | ORAL | 0 refills | Status: DC

## 2016-08-30 MED ORDER — PAROXETINE HCL 20 MG PO TABS: 20.0000 mg | ORAL_TABLET | Freq: Every day | ORAL | 0 refills | Status: DC

## 2016-08-30 MED ORDER — AMPHETAMINE-DEXTROAMPHETAMINE 10 MG PO TABS: ORAL_TABLET | ORAL | 0 refills | Status: DC

## 2016-08-30 MED ORDER — CETIRIZINE HCL 10 MG PO TABS: 10.0000 mg | ORAL_TABLET | Freq: Every day | ORAL | 0 refills | Status: DC

## 2016-08-30 NOTE — Telephone Encounter (Signed)
Prescriptions sent electronically to pharmacy.  2 controlled substances picked up at front desk

## 2016-08-30 NOTE — Telephone Encounter (Signed)
Ok lets do because of circumstances go ahead and do thirty days worth, write further refills frm specialist on each rx

## 2016-08-30 NOTE — Telephone Encounter (Signed)
[facility]  The doctor he sees there isn't going to be in for another 2 months, but there is another doctor coming in on 09/12/16 that he can see.  Darryl LongsJoseph will be out of his medication before the appointment and Darryl Hickman from his group home is requesting that Darryl Hickman refill his medication for 2 weeks.  He will need Rx for adderall 20 mg daily at 8:00 am, adderall 10 mg daily at 12:30 and paxil 20 mg one daily at 8, zyrtec 10 mg daily at 8:00 am.   He will be out on Thursday.  If any questinos, please [facility] [phone].

## 2016-09-12 ENCOUNTER — Other Ambulatory Visit: Payer: Self-pay | Admitting: Family Medicine

## 2016-09-29 ENCOUNTER — Telehealth: Payer: Self-pay | Admitting: Family Medicine

## 2016-09-29 MED ORDER — AMPHETAMINE-DEXTROAMPHETAMINE 20 MG PO TABS: ORAL_TABLET | ORAL | 0 refills | Status: DC

## 2016-09-29 MED ORDER — AMPHETAMINE-DEXTROAMPHETAMINE 10 MG PO TABS: ORAL_TABLET | ORAL | 0 refills | Status: DC

## 2016-09-29 MED ORDER — CETIRIZINE HCL 10 MG PO TABS: ORAL_TABLET | ORAL | 5 refills | Status: AC

## 2016-09-29 MED ORDER — PAROXETINE HCL 20 MG PO TABS: 20.0000 mg | ORAL_TABLET | Freq: Every day | ORAL | 0 refills | Status: DC

## 2016-09-29 NOTE — Telephone Encounter (Signed)
Prescriptions refilled as directed by Dr Brett CanalesSteve -group home notified

## 2016-09-29 NOTE — Telephone Encounter (Signed)
I do not like to this bevause it implies that we are managing the mental health side of things and we are not, but since unable to do otherwise, ok to ref ill times two mo

## 2016-09-29 NOTE — Telephone Encounter (Signed)
[facility] has an appointment [facility] on 11/07/16, but he will be out of his medication before then.  Andreas BlowerAnita Marley from patients group home is wanting to know if we can refill his medication like we did in January to get him through to his appointment.   Paxil 20 mg Adderall 20 mg Adderall 10 mg Zyrtec 10 mg   [facility] [place]

## 2017-01-31 ENCOUNTER — Telehealth: Payer: Self-pay | Admitting: Family Medicine

## 2017-01-31 NOTE — Telephone Encounter (Signed)
Nurse part done. Form in dr steve's office

## 2017-01-31 NOTE — Telephone Encounter (Signed)
[employer] faxed over forms to be completed for Jomarie LongsJoseph to go to camp.  See in forms basket.

## 2017-02-03 NOTE — Telephone Encounter (Signed)
done

## 2017-02-03 NOTE — Telephone Encounter (Signed)
Darryl Hickman is requesting for this form to be completed and faxed to her today.  Darryl Hickman has run away and they are needing to get him into camp ASAP.  Fax to [phone]

## 2017-06-13 ENCOUNTER — Encounter: Payer: Medicaid Other | Attending: Internal Medicine | Admitting: Internal Medicine

## 2017-06-13 DIAGNOSIS — F39 Unspecified mood [affective] disorder: Secondary | ICD-10-CM | POA: Diagnosis not present

## 2017-06-13 DIAGNOSIS — T8131XS Disruption of external operation (surgical) wound, not elsewhere classified, sequela: Secondary | ICD-10-CM | POA: Insufficient documentation

## 2017-06-13 DIAGNOSIS — F419 Anxiety disorder, unspecified: Secondary | ICD-10-CM | POA: Diagnosis not present

## 2017-06-13 DIAGNOSIS — Q5310 Unspecified undescended testicle, unilateral: Secondary | ICD-10-CM | POA: Diagnosis present

## 2017-06-13 DIAGNOSIS — F71 Moderate intellectual disabilities: Secondary | ICD-10-CM | POA: Insufficient documentation

## 2017-06-13 DIAGNOSIS — E039 Hypothyroidism, unspecified: Secondary | ICD-10-CM | POA: Diagnosis not present

## 2017-06-13 DIAGNOSIS — Y839 Surgical procedure, unspecified as the cause of abnormal reaction of the patient, or of later complication, without mention of misadventure at the time of the procedure: Secondary | ICD-10-CM | POA: Insufficient documentation

## 2017-06-17 NOTE — Progress Notes (Signed)
Darryl Hickman, Darryl E. ([id number]) Visit Report for 06/13/2017 HPI Details Patient Name: Darryl Hickman, Darryl E. Date of Service: 06/13/2017 12:30 PM Medical Record Number: [id number] Patient Account Number: [id number] Date of Birth/Sex: 06/10/1996 (21 y.o. Male) Treating RN: Huel CoventryWoody, Kim Primary Care Provider: Franco NonesLINDLEY, CHERYL Other Clinician: Referring Provider: Franco NonesLINDLEY, CHERYL Treating Provider/Extender: Maxwell CaulOBSON, Ugochi Henzler G Weeks in Treatment: 0 History of Present Illness HPI Description: 06/13/17; this is a 21 year old young man who according to his primary care notes has moderate intellectual disability as well as other psychiatric issues. 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 This was done because of an undescended testing on the right. He was seen earlier this month by his primary care provider I believe a swab culture of the nonhealing area revealed MRSA and he was treated with Septra and had topical Bactroban. The patient lives in a group home and they're apparently changing his dressing there. 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 He has morbid obesity. The exact nature of his intellectual issues is unclear from reviewing the records available. The patient is awake does not complain of pain. He has had no fever Electronic Signature(s) Signed: 06/13/2017 4:48:36 PM By: Baltazar Najjarobson, Lachanda Buczek MD Entered By: Baltazar Najjarobson, Yurem Viner on 06/13/2017 13:57:56 Althouse, [name] ([id number]) -------------------------------------------------------------------------------- Physical Exam Details Patient Name: Darryl MorningADAMS, Darryl E. Date of Service: 06/13/2017 12:30 PM Medical Record Number: [id number] Patient Account Number: [id number] Date of Birth/Sex: 02/10/1996 (21 y.o. Male) Treating RN: Huel CoventryWoody, Kim Primary Care Provider: Franco NonesLINDLEY, CHERYL Other  Clinician: Referring Provider: Franco NonesLINDLEY, CHERYL Treating Provider/Extender: Maxwell CaulOBSON, Dewain Platz G Weeks in Treatment: 0 Constitutional Sitting or standing Blood Pressure is within target range for patient.. Pulse regular and within target range for patient.Marland Kitchen. Respirations regular, non-labored and within target range.. Temperature is normal and within the target range for the patient.Marland Kitchen. appears in no distress. Eyes Conjunctivae clear. No discharge. Neck No thyroid is palpable. Respiratory Respiratory effort is easy and symmetric bilaterally. Rate is normal at rest and on room air.. Gastrointestinal (GI) Abdomen is soft and non-distended without masses or tenderness. Bowel sounds active in all quadrants.. No liver or spleen enlargement or tenderness.. Lymphatic None palpable in the inguinal area. No lymphadenopathy or glandular swelling noted in neck.. Integumentary (Hair, Skin) The patient has erythema in the bilateral face the that the inguinal fold. There is coledomes. Psychiatric Somewhat slow to respond but calm and cooperative.. Notes Wound exam; horizontal incision just above the symphysis pubis on the right. Small clean wound with 2 linear open areas. There is no surrounding cellulitis crepitus and no evidence of infection Electronic Signature(s) Signed: 06/13/2017 4:48:36 PM By: Baltazar Najjarobson, Niah Heinle MD Entered By: Baltazar Najjarobson, Keta [place] on 06/13/2017 14:00:21 Staley, [name] ([id number]) -------------------------------------------------------------------------------- Physician Orders Details Patient Name: Darryl MorningADAMS, Darryl E. Date of Service: 06/13/2017 12:30 PM Medical Record Number: [id number] Patient Account Number: [id number] Date of Birth/Sex: 08/27/1995 (21 y.o. Male) Treating RN: Huel CoventryWoody, Kim Primary Care Provider: Franco NonesLINDLEY, CHERYL Other Clinician: Referring Provider: Franco NonesLINDLEY, CHERYL Treating Provider/Extender: Altamese CarolinaOBSON, Renatta Shrieves G Weeks in Treatment: 0 Verbal / Phone Orders: No Diagnosis  Coding Wound Cleansing Wound #1 Right Abdomen - Lower Quadrant o Clean wound with Normal Saline. Primary Wound Dressing Wound #1 Right Abdomen - Lower Quadrant o Prisma Ag Secondary Dressing o Other - [place] Dressing Change Frequency Wound #1 Right Abdomen - Lower Quadrant o Change dressing every other day. - and  as needed Follow-up Appointments Wound #1 Right Abdomen - Lower Quadrant o Return Appointment in 1 week. Electronic Signature(s) Signed: 06/13/2017 4:48:36 PM By: Baltazar Najjar MD Signed: 06/15/2017 5:34:16 PM By: Elliot Gurney, BSN, RN, CWS, Kim RN, BSN Entered By: Elliot Gurney, BSN, RN, CWS, Kim on 06/13/2017 13:33:31 Parkhurst, [name] ([id number]) -------------------------------------------------------------------------------- Problem List Details Patient Name: Darryl Morning Date of Service: 06/13/2017 12:30 PM Medical Record Number: [id number] Patient Account Number: [id number] Date of Birth/Sex: 09-19-1995 (21 y.o. Male) Treating RN: Huel Coventry Primary Care Provider: Franco Nones Other Clinician: Referring Provider: Franco Nones Treating Provider/Extender: Maxwell Caul Weeks in Treatment: 0 Active Problems ICD-10 Encounter Code Description Active Date Diagnosis T81.31XS Disruption of external operation (surgical) wound, not elsewhere 06/13/2017 Yes classified, sequela Q53.10 Unspecified undescended testicle, unilateral 06/13/2017 Yes Inactive Problems Resolved Problems Electronic Signature(s) Signed: 06/13/2017 4:48:36 PM By: Baltazar Najjar MD Entered By: Baltazar Najjar on 06/13/2017 13:54:11 Krikorian, [name] ([id number]) -------------------------------------------------------------------------------- Progress Note Details Patient Name: Darryl Morning Date of Service: 06/13/2017 12:30 PM Medical Record Number: [id number] Patient Account Number: [id number] Date of Birth/Sex: October 23, 1995 (20 y.o. Male) Treating RN: Huel Coventry Primary Care  Provider: Franco Nones Other Clinician: Referring Provider: Franco Nones Treating Provider/Extender: Maxwell Caul Weeks in Treatment: 0 Subjective History of Present Illness (HPI) 06/13/17; this is a 21 year old young man who according to his primary care notes has moderate intellectual disability as well as other psychiatric issues. 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 This was done because of an undescended testing on the right. He was seen earlier this month by his primary care provider I believe a swab culture of the nonhealing area revealed MRSA and he was treated with Septra and had topical Bactroban. The patient lives in a group home and they're apparently changing his dressing there. 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 He has morbid obesity. The exact nature of his intellectual issues is unclear from reviewing the records available. The patient is awake does not complain of pain. He has had no fever Wound History Patient reportedly has not tested positive for osteomyelitis. Patient reportedly has not had testing performed to evaluate circulation in the legs. Patient History Information obtained from Patient, Caregiver, Chart. Allergies No Known Drug Allergies Family History Lung Disease - Father,Mother. Social History Never smoker, Marital Status - Single, Alcohol Use - Never, Drug Use - No History, Caffeine Use - Moderate. Medical History Eyes Denies history of Cataracts, Glaucoma, Optic Neuritis Ear/Nose/Mouth/Throat Denies history of Chronic sinus problems/congestion, Middle ear problems Hematologic/Lymphatic Denies history of Anemia, Hemophilia, Human Immunodeficiency Virus, Lymphedema, Sickle Cell Disease Respiratory Denies history of Aspiration, Asthma, Chronic Obstructive Pulmonary  Disease (COPD), Pneumothorax, Sleep Apnea, Tuberculosis Cardiovascular Denies history of Angina, Arrhythmia, Congestive Heart Failure, Coronary Artery Disease, Deep Vein Thrombosis, Hypertension, Hypotension, Myocardial Infarction, Peripheral Arterial Disease, Peripheral Venous Disease, Phlebitis, Vasculitis Gastrointestinal Denies history of Cirrhosis , Colitis, Crohn s, Hepatitis A, Hepatitis B, Hepatitis C Endocrine AHMED, INNISS ([id number]) Denies history of Type I Diabetes Genitourinary Denies history of End Stage Renal Disease Immunological Denies history of Lupus Erythematosus, Raynaud s, Scleroderma Integumentary (Skin) Denies history of History of Burn, History of pressure wounds Musculoskeletal Denies history of Gout, Rheumatoid Arthritis, Osteoarthritis, Osteomyelitis Neurologic Denies history of Dementia, Neuropathy, Quadriplegia, Paraplegia, Seizure Disorder Oncologic Denies history of Received Chemotherapy, Received Radiation Psychiatric Denies history  of Anorexia/bulimia, Confinement Anxiety Review of Systems (ROS) Constitutional Symptoms (General Health) Denies complaints or symptoms of Fatigue, Fever, Chills, Marked Weight Change. Eyes Denies complaints or symptoms of Dry Eyes, Vision Changes. Ear/Nose/Mouth/Throat Denies complaints or symptoms of Difficult clearing ears, Sinusitis. Hematologic/Lymphatic Denies complaints or symptoms of Bleeding / Clotting Disorders, Human Immunodeficiency Virus. Respiratory Denies complaints or symptoms of Chronic or frequent coughs, Shortness of Breath. Cardiovascular Denies complaints or symptoms of Chest pain, LE edema. Gastrointestinal Denies complaints or symptoms of Frequent diarrhea, Nausea, Vomiting. Endocrine Complains or has symptoms of Thyroid disease. Denies complaints or symptoms of Hepatitis, Polydypsia (Excessive Thirst). Genitourinary Denies complaints or symptoms of Kidney failure/ Dialysis,  Incontinence/dribbling. Immunological Denies complaints or symptoms of Hives, Itching. Integumentary (Skin) Complains or has symptoms of Wounds. Denies complaints or symptoms of Bleeding or bruising tendency, Breakdown, Swelling. Musculoskeletal The patient has no complaints or symptoms. Neurologic Denies complaints or symptoms of Numbness/parasthesias, Focal/Weakness. Psychiatric Complains or has symptoms of Anxiety. Denies complaints or symptoms of Claustrophobia. Objective Vallin, Elija E. ([id number]) Constitutional Sitting or standing Blood Pressure is within target range for patient.. Pulse regular and within target range for patient.Marland Kitchen Respirations regular, non-labored and within target range.. Temperature is normal and within the target range for the patient.Marland Kitchen appears in no distress. Vitals Time Taken: 12:50 PM, Height: 61 in, Source: Stated, Weight: 198 lbs, Source: Stated, BMI: 37.4, Temperature: 98.7  F, Pulse: 105 bpm, Respiratory Rate: 18 breaths/min, Blood Pressure: 114/62 mmHg. Eyes Conjunctivae clear. No discharge. Neck No thyroid is palpable. Respiratory Respiratory effort is easy and symmetric bilaterally. Rate is normal at rest and on room air.. Gastrointestinal (GI) Abdomen is soft and non-distended without masses or tenderness. Bowel sounds active in all quadrants.. No liver or spleen enlargement or tenderness.. Lymphatic None palpable in the inguinal area. No lymphadenopathy or glandular swelling noted in neck.. Psychiatric Somewhat slow to respond but calm and cooperative.. General Notes: Wound exam; horizontal incision just above the symphysis pubis on the right. Small clean wound with 2 linear open areas. There is no surrounding cellulitis crepitus and no evidence of infection Integumentary (Hair, Skin) The patient has erythema in the bilateral face the that the inguinal fold. There is coledomes. Wound #1 status is Open. Original cause of wound was  Surgical Injury. The wound is located on the Right Abdomen - Lower Quadrant. The wound measures 0.2cm length x 3.2cm width x 0.1cm depth; 0.503cm^2 area and 0.05cm^3 volume. The wound is limited to skin breakdown. There is a small amount of serous drainage noted. The wound margin is flat and intact. There is large (67-100%) pink granulation within the wound bed. There is no necrotic tissue within the wound bed. The periwound skin appearance exhibited: Scarring. The periwound skin appearance did not exhibit: Callus, Crepitus, Excoriation, Induration, Rash, Dry/Scaly, Maceration, Atrophie Blanche, Cyanosis, Ecchymosis, Hemosiderin Staining, Mottled, Pallor, Rubor, Erythema. Assessment Active Problems ICD-10 T81.31XS - Disruption of external operation (surgical) wound, not elsewhere classified, sequela Q53.10 - Unspecified undescended testicle, unilateral Moskal, Nainoa E. ([id number]) Plan Wound Cleansing: Wound #1 Right Abdomen - Lower Quadrant: Clean wound with Normal Saline. Primary Wound Dressing: Wound #1 Right Abdomen - Lower Quadrant: Prisma Ag Secondary Dressing: Other - [place] Dressing Change Frequency: Wound #1 Right Abdomen - Lower Quadrant: Change dressing every other day. - and as needed Follow-up Appointments: Wound #1 Right Abdomen - Lower Quadrant: Return Appointment in 1 week. #1 small nonhealing surgical wound presumably from a right orchiectomy in November 2017. The base of this  appears clean. We applied Silver collagen border foam and they'll change this in the group home where he lives. #2 will review this again in a week #3 I suspect the patient has rosacea #4 treated for M RSA earlier this month although I see no evidence of infection currently and no need for additional antibiotics Electronic Signature(s) Signed: 06/13/2017 4:48:36 PM By: Baltazar Najjar MD Entered By: Baltazar Najjar on 06/13/2017 14:02:25 Arrick, [name]  ([id number]) -------------------------------------------------------------------------------- ROS/PFSH Details Patient Name: Darryl Morning Date of Service: 06/13/2017 12:30 PM Medical Record Number: [id number] Patient Account Number: [id number] Date of Birth/Sex: Jan 10, 1996 (20 y.o. Male) Treating RN: Huel Coventry Primary Care Provider: Franco Nones Other Clinician: Referring Provider: Franco Nones Treating Provider/Extender: Maxwell Caul Weeks in Treatment: 0 Information Obtained From Patient Caregiver Chart Wound History Do you currently have one or more open woundso No Have you tested positive for osteomyelitis (bone infection)o No Have you had any tests for circulation on your legso No Constitutional Symptoms (General Health) Complaints and Symptoms: Negative for: Fatigue; Fever; Chills; Marked Weight Change Eyes Complaints and Symptoms: Negative for: Dry Eyes; Vision Changes Medical History: Negative for: Cataracts; Glaucoma; Optic Neuritis Ear/Nose/Mouth/Throat Complaints and Symptoms: Negative for: Difficult clearing ears; Sinusitis Medical History: Negative for: Chronic sinus problems/congestion; Middle ear problems Hematologic/Lymphatic Complaints and Symptoms: Negative for: Bleeding / Clotting Disorders; Human Immunodeficiency Virus Medical History: Negative for: Anemia; Hemophilia; Human Immunodeficiency Virus; Lymphedema; Sickle Cell Disease Respiratory Complaints and Symptoms: Negative for: Chronic or frequent coughs; Shortness of Breath Medical History: Negative for: Aspiration; Asthma; Chronic Obstructive Pulmonary Disease (COPD); Pneumothorax; Sleep Apnea; Tuberculosis Cardiovascular Complaints and Symptoms: Negative for: Chest pain; LE edema Medical History: WHALEN, TROMPETER ([id number]) Negative for: Angina; Arrhythmia; Congestive Heart Failure; Coronary Artery Disease; Deep Vein Thrombosis; Hypertension; Hypotension; Myocardial Infarction;  Peripheral Arterial Disease; Peripheral Venous Disease; Phlebitis; Vasculitis Gastrointestinal Complaints and Symptoms: Negative for: Frequent diarrhea; Nausea; Vomiting Medical History: Negative for: Cirrhosis ; Colitis; Crohnos; Hepatitis A; Hepatitis B; Hepatitis C Endocrine Complaints and Symptoms: Positive for: Thyroid disease Negative for: Hepatitis; Polydypsia (Excessive Thirst) Medical History: Negative for: Type I Diabetes Genitourinary Complaints and Symptoms: Negative for: Kidney failure/ Dialysis; Incontinence/dribbling Medical History: Negative for: End Stage Renal Disease Immunological Complaints and Symptoms: Negative for: Hives; Itching Medical History: Negative for: Lupus Erythematosus; Raynaudos; Scleroderma Integumentary (Skin) Complaints and Symptoms: Positive for: Wounds Negative for: Bleeding or bruising tendency; Breakdown; Swelling Medical History: Negative for: History of Burn; History of pressure wounds Neurologic Complaints and Symptoms: Negative for: Numbness/parasthesias; Focal/Weakness Medical History: Negative for: Dementia; Neuropathy; Quadriplegia; Paraplegia; Seizure Disorder Psychiatric Complaints and Symptoms: Positive for: Anxiety Negative for: Claustrophobia IZEYAH, DEIKE ([id number]) Medical History: Negative for: Anorexia/bulimia; Confinement Anxiety Musculoskeletal Complaints and Symptoms: No Complaints or Symptoms Medical History: Negative for: Gout; Rheumatoid Arthritis; Osteoarthritis; Osteomyelitis Oncologic Medical History: Negative for: Received Chemotherapy; Received Radiation Immunizations Pneumococcal Vaccine: Received Pneumococcal Vaccination: No Implantable Devices Family and Social History Lung Disease: Yes - Father,Mother; Never smoker; Marital Status - Single; Alcohol Use: Never; Drug Use: No History; Caffeine Use: Moderate; Financial Concerns: No; Food, Clothing or Shelter Needs: No; Support System  Lacking: No; Transportation Concerns: No; Advanced Directives: No; Do not resuscitate: No; Living Will: No; Medical Power of Attorney: No Psychologist, prison and probation services) Signed: 06/13/2017 4:48:36 PM By: Baltazar Najjar MD Signed: 06/15/2017 5:34:16 PM By: Elliot Gurney, BSN, RN, CWS, Kim RN, BSN Entered By: Elliot Gurney, BSN, RN, CWS, Kim on 06/13/2017 13:07:02 Wiedemann, [name] ([id number]) -------------------------------------------------------------------------------- SuperBill Details Patient Name: Darryl Morning Date  of Service: 06/13/2017 Medical Record Number: [id number] Patient Account Number: [id number] Date of Birth/Sex: 09-26-1995 (20 y.o. Male) Treating RN: Huel Coventry Primary Care Provider: Franco Nones Other Clinician: Referring Provider: Franco Nones Treating Provider/Extender: Maxwell Caul Weeks in Treatment: 0 Diagnosis Coding ICD-10 Codes Code Description T81.31XS Disruption of external operation (surgical) wound, not elsewhere classified, sequela Q53.10 Unspecified undescended testicle, unilateral Z86.14 Personal history of Methicillin resistant Staphylococcus aureus infection Facility Procedures CPT4 Code: [id number] Description: 99214 - WOUND CARE VISIT-LEV 4 EST PT Modifier: Quantity: 1 Physician Procedures CPT4 Code Description: [id number] WC PHYS LEVEL 3 o NEW PT ICD-10 Diagnosis Description T81.31XS Disruption of external operation (surgical) wound, not elsewh Q53.10 Unspecified undescended testicle, unilateral Z86.14 Personal history of Methicillin  resistant Staphylococcus aure Modifier: ere classified, s [place] infection Quantity: 1 equela Electronic Signature(s) Signed: 06/13/2017 4:48:36 PM By: Baltazar Najjar MD Entered By: Baltazar Najjar on 06/13/2017 14:03:17

## 2017-06-17 NOTE — Progress Notes (Signed)
[name], [name] ([id number]) Visit Report for 06/13/2017 Allergy List Details Patient Name: Darryl Hickman Date of Service: 06/13/2017 12:30 PM Medical Record Number: [id number] Patient Account Number: [id number] Date of Birth/Sex: 01-15-1996 (21 y.o. Male) Treating RN: Huel Coventry Primary Care Dyanna Seiter: Franco Nones Other Clinician: Referring Doneen Ollinger: Franco Nones Treating Ollie Delano/Extender: Maxwell Caul Weeks in Treatment: 0 Allergies Active Allergies No Known Drug Allergies Allergy Notes Electronic Signature(s) Signed: 06/15/2017 5:34:16 PM By: Elliot Gurney, BSN, RN, CWS, Kim RN, BSN Entered By: Elliot Gurney, BSN, RN, CWS, Kim on 06/13/2017 12:58:13 Vester, [name] ([id number]) -------------------------------------------------------------------------------- Arrival Information Details Patient Name: Darryl Hickman Date of Service: 06/13/2017 12:30 PM Medical Record Number: [id number] Patient Account Number: [id number] Date of Birth/Sex: 1995/11/28 (21 y.o. Male) Treating RN: Huel Coventry Primary Care Ger Ringenberg: Franco Nones Other Clinician: Referring Pedro Oldenburg: Franco Nones Treating Sharlyne Koeneman/Extender: Altamese [place] in Treatment: 0 Visit Information Patient Arrived: Ambulatory Arrival Time: 12:49 Accompanied By: caregiver Transfer Assistance: None Patient Identification Verified: Yes Secondary Verification Process Completed: Yes Patient Requires Transmission-Based No Precautions: Patient Has Alerts: No Electronic Signature(s) Signed: 06/15/2017 5:34:16 PM By: Elliot Gurney, BSN, RN, CWS, Kim RN, BSN Entered By: Elliot Gurney, BSN, RN, CWS, Kim on 06/13/2017 12:50:22 Betts, [name] ([id number]) -------------------------------------------------------------------------------- Clinic Level of Care Assessment Details Patient Name: Darryl Hickman Date of Service: 06/13/2017 12:30 PM Medical Record Number: [id number] Patient Account Number: [id number] Date of Birth/Sex: 1996-02-22  (21 y.o. Male) Treating RN: Huel Coventry Primary Care Quinnlyn Hearns: Franco Nones Other Clinician: Referring Mikylah Ackroyd: Franco Nones Treating Brannen Koppen/Extender: Maxwell Caul Weeks in Treatment: 0 Clinic Level of Care Assessment Items TOOL 2 Quantity Score []  - Use when only an EandM is performed on the INITIAL visit 0 ASSESSMENTS - Nursing Assessment / Reassessment X - General Physical Exam (combine w/ comprehensive assessment (listed just below) when 1 20 performed on new pt. evals) X- 1 25 Comprehensive Assessment (HX, ROS, Risk Assessments, Wounds Hx, etc.) ASSESSMENTS - Wound and Skin Assessment / Reassessment X - Simple Wound Assessment / Reassessment - one wound 1 5 []  - 0 Complex Wound Assessment / Reassessment - multiple wounds []  - 0 Dermatologic / Skin Assessment (not related to wound area) ASSESSMENTS - Ostomy and/or Continence Assessment and Care []  - Incontinence Assessment and Management 0 []  - 0 Ostomy Care Assessment and Management (repouching, etc.) PROCESS - Coordination of Care X - Simple Patient / Family Education for ongoing care 1 15 []  - 0 Complex (extensive) Patient / Family Education for ongoing care X- 1 10 Staff obtains Chiropractor, Records, Test Results / Process Orders []  - 0 Staff telephones HHA, Nursing Homes / Clarify orders / etc []  - 0 Routine Transfer to another Facility (non-emergent condition) []  - 0 Routine Hospital Admission (non-emergent condition) X- 1 15 New Admissions / Manufacturing engineer / Ordering NPWT, Apligraf, etc. []  - 0 Emergency Hospital Admission (emergent condition) X- 1 10 Simple Discharge Coordination []  - 0 Complex (extensive) Discharge Coordination PROCESS - Special Needs []  - Pediatric / Minor Patient Management 0 []  - 0 Isolation Patient Management Ballowe, MALAKI KOURY. ([id number]) []  - 0 Hearing / Language / Visual special needs []  - 0 Assessment of Community assistance (transportation, D/C planning,  etc.) []  - 0 Additional assistance / Altered mentation []  - 0 Support Surface(s) Assessment (bed, cushion, seat, etc.) INTERVENTIONS - Wound Cleansing / Measurement X - Wound Imaging (photographs - any number of wounds) 1 5 []  - 0 Wound Tracing (instead of photographs) X- 1 5 Simple Wound Measurement -  one wound []  - 0 Complex Wound Measurement - multiple wounds X- 1 5 Simple Wound Cleansing - one wound []  - 0 Complex Wound Cleansing - multiple wounds INTERVENTIONS - Wound Dressings X - Small Wound Dressing one or multiple wounds 1 10 []  - 0 Medium Wound Dressing one or multiple wounds []  - 0 Large Wound Dressing one or multiple wounds []  - 0 Application of Medications - injection INTERVENTIONS - Miscellaneous []  - External ear exam 0 []  - 0 Specimen Collection (cultures, biopsies, blood, body fluids, etc.) []  - 0 Specimen(s) / Culture(s) sent or taken to Lab for analysis []  - 0 Patient Transfer (multiple staff / Nurse, adult / Similar devices) []  - 0 Simple Staple / Suture removal (25 or less) []  - 0 Complex Staple / Suture removal (26 or more) []  - 0 Hypo / Hyperglycemic Management (close monitor of Blood Glucose) []  - 0 Ankle / Brachial Index (ABI) - do not check if billed separately Has the patient been seen at the hospital within the last three years: Yes Total Score: 125 Level Of Care: New/Established - Level 4 Electronic Signature(s) Signed: 06/15/2017 5:34:16 PM By: Elliot Gurney, BSN, RN, CWS, Kim RN, BSN Entered By: Elliot Gurney, BSN, RN, CWS, Kim on 06/13/2017 13:47:46 Bucker, [name] ([id number]) -------------------------------------------------------------------------------- Encounter Discharge Information Details Patient Name: Darryl Hickman Date of Service: 06/13/2017 12:30 PM Medical Record Number: [id number] Patient Account Number: [id number] Date of Birth/Sex: [birth year] (21 y.o. Male) Treating RN: Huel Coventry Primary Care Carmela Piechowski: Franco Nones Other  Clinician: Referring Gideon Burstein: Franco Nones Treating Mya Suell/Extender: Altamese [place] in Treatment: 0 Encounter Discharge Information Items Discharge Pain Level: 0 Discharge Condition: Stable Ambulatory Status: Ambulatory Discharge Destination: Home Transportation: Private Auto Accompanied By: caregiver Schedule Follow-up Appointment: Yes Medication Reconciliation completed and Yes provided to Patient/Care Kodey Xue: Provided on Clinical Summary of Care: 06/13/2017 Form Type Recipient Paper Patient JA Electronic Signature(s) Signed: 06/15/2017 5:34:16 PM By: Elliot Gurney, BSN, RN, CWS, Kim RN, BSN Entered By: Elliot Gurney, BSN, RN, CWS, Kim on 06/13/2017 13:48:52 Leever, [name] ([id number]) -------------------------------------------------------------------------------- Lower Extremity Assessment Details Patient Name: Darryl Hickman Date of Service: 06/13/2017 12:30 PM Medical Record Number: [id number] Patient Account Number: [id number] Date of Birth/Sex: November 12, 1995 (21 y.o. Male) Treating RN: Huel Coventry Primary Care Oberia Beaudoin: Franco Nones Other Clinician: Referring Ardythe Klute: Franco Nones Treating Karee Christopherson/Extender: Maxwell Caul Weeks in Treatment: 0 Electronic Signature(s) Signed: 06/15/2017 5:34:16 PM By: Elliot Gurney, BSN, RN, CWS, Kim RN, BSN Entered By: Elliot Gurney, BSN, RN, CWS, Kim on 06/13/2017 13:11:48 Lattner, [name] ([id number]) -------------------------------------------------------------------------------- Multi Wound Chart Details Patient Name: Darryl Hickman Date of Service: 06/13/2017 12:30 PM Medical Record Number: [id number] Patient Account Number: [id number] Date of Birth/Sex: [birth year] (21 y.o. Male) Treating RN: Huel Coventry Primary Care Johnmatthew Solorio: Franco Nones Other Clinician: Referring Westley Blass: Franco Nones Treating Tris Howell/Extender: Maxwell Caul Weeks in Treatment: 0 Vital Signs Height(in): 61 Pulse(bpm): 105 Weight(lbs): 198 Blood  Pressure(mmHg): 114/62 Body Mass Index(BMI): 37 Temperature(F): 98.7 Respiratory Rate 18 (breaths/min): Photos: [1:No Photos] [N/A:N/A] Wound Location: [1:Right Abdomen - Lower Quadrant] [N/A:N/A] Wounding Event: [1:Surgical Injury] [N/A:N/A] Primary Etiology: [1:Dehisced Wound] [N/A:N/A] Secondary Etiology: [1:Open Surgical Wound] [N/A:N/A] Date Acquired: [1:04/11/2017] [N/A:N/A] Weeks of Treatment: [1:0] [N/A:N/A] Wound Status: [1:Open] [N/A:N/A] Measurements L x W x D [1:0.2x3.2x0.1] [N/A:N/A] (cm) Area (cm) : [1:0.503] [N/A:N/A] Volume (cm) : [1:0.05] [N/A:N/A] Classification: [1:Partial Thickness] [N/A:N/A] Exudate Amount: [1:Small] [N/A:N/A] Exudate Type: [1:Serous] [N/A:N/A] Exudate Color: [1:amber] [N/A:N/A] Wound Margin: [1:Flat and Intact] [N/A:N/A] Granulation Amount: [1:Large (67-100%)] [N/A:N/A] Granulation  Quality: [1:Pink] [N/A:N/A] Necrotic Amount: [1:None Present (0%)] [N/A:N/A] Exposed Structures: [1:Fascia: No Fat Layer (Subcutaneous Tissue) Exposed: No Tendon: No Muscle: No Joint: No Bone: No Limited to Skin Breakdown] [N/A:N/A] Periwound Skin Texture: [1:Scarring: Yes Excoriation: No Induration: No Callus: No Crepitus: No Rash: No] [N/A:N/A] Periwound Skin Moisture: [1:Maceration: No Dry/Scaly: No] [N/A:N/A] Periwound Skin Color: Atrophie Blanche: No N/A N/A Cyanosis: No Ecchymosis: No Erythema: No Hemosiderin Staining: No Mottled: No Pallor: No Rubor: No Tenderness on Palpation: No N/A N/A Wound Preparation: Ulcer Cleansing: Wound N/A N/A Cleanser Topical Anesthetic Applied: Other: Lidocaine 4 % Treatment Notes Wound #1 (Right Abdomen - Lower Quadrant) 1. Cleansed with: Clean wound with Normal Saline 4. Dressing Applied: Prisma Ag 5. Secondary Dressing Applied Bordered Foam Dressing Electronic Signature(s) Signed: 06/13/2017 4:48:36 PM By: Baltazar Najjarobson, Michael MD Entered By: Baltazar Najjarobson, Michael on 06/13/2017 13:54:23 Vidaurri, [name]  ([id number]) -------------------------------------------------------------------------------- Multi-Disciplinary Care Plan Details Patient Name: Darryl MorningADAMS, Jahred E. Date of Service: 06/13/2017 12:30 PM Medical Record Number: [id number] Patient Account Number: [id number] Date of Birth/Sex: 09/20/1995 (21 y.o. Male) Treating RN: Huel CoventryWoody, Kim Primary Care Kilee Hedding: Franco NonesLINDLEY, CHERYL Other Clinician: Referring Denario Bagot: Franco NonesLINDLEY, CHERYL Treating Dishon Kehoe/Extender: Altamese CarolinaOBSON, MICHAEL G Weeks in Treatment: 0 Active Inactive ` Orientation to the Wound Care Program Nursing Diagnoses: Knowledge deficit related to the wound healing center program Goals: Patient/caregiver will verbalize understanding of the Wound Healing Center Program Date Initiated: 06/13/2017 Target Resolution Date: 06/19/2017 Goal Status: Active Interventions: Provide education on orientation to the wound center Notes: ` Wound/Skin Impairment Nursing Diagnoses: Impaired tissue integrity Goals: Ulcer/skin breakdown will have a volume reduction of 30% by week 4 Date Initiated: 06/13/2017 Target Resolution Date: 07/14/2017 Goal Status: Active Interventions: Assess ulceration(s) every visit Treatment Activities: Topical wound management initiated : 06/13/2017 Notes: Electronic Signature(s) Signed: 06/15/2017 5:34:16 PM By: Elliot GurneyWoody, BSN, RN, CWS, Kim RN, BSN Entered By: Elliot GurneyWoody, BSN, RN, CWS, Kim on 06/13/2017 13:46:59 Hallstrom, [name] ([id number]) -------------------------------------------------------------------------------- Pain Assessment Details Patient Name: Darryl MorningADAMS, Coady E. Date of Service: 06/13/2017 12:30 PM Medical Record Number: [id number] Patient Account Number: [id number] Date of Birth/Sex: 09/23/1995 (21 y.o. Male) Treating RN: Huel CoventryWoody, Kim Primary Care Juno Bozard: Franco NonesLINDLEY, CHERYL Other Clinician: Referring Toshiro Hanken: Franco NonesLINDLEY, CHERYL Treating Jahquez Steffler/Extender: Maxwell CaulOBSON, MICHAEL G Weeks in Treatment: 0 Active  Problems Location of Pain Severity and Description of Pain Patient Has Paino No Site Locations Pain Management and Medication Current Pain Management: Electronic Signature(s) Signed: 06/15/2017 5:34:16 PM By: Elliot GurneyWoody, BSN, RN, CWS, Kim RN, BSN Entered By: Elliot GurneyWoody, BSN, RN, CWS, Kim on 06/13/2017 12:50:40 Avellino, [name] ([id number]) -------------------------------------------------------------------------------- Patient/Caregiver Education Details Patient Name: Darryl MorningADAMS, Sawyer E. Date of Service: 06/13/2017 12:30 PM Medical Record Number: [id number] Patient Account Number: [id number] Date of Birth/Gender: 07/22/1996 (21 y.o. Male) Treating RN: Huel CoventryWoody, Kim Primary Care Physician: Franco NonesLINDLEY, CHERYL Other Clinician: Referring Physician: Franco NonesLINDLEY, CHERYL Treating Physician/Extender: Altamese CarolinaOBSON, MICHAEL G Weeks in Treatment: 0 Education Assessment Education Provided To: Patient and Caregiver Education Topics Provided Wound/Skin Impairment: Handouts: Caring for Your Ulcer, Other: wound care as prescribed Methods: Demonstration Responses: State content correctly Electronic Signature(s) Signed: 06/15/2017 5:34:16 PM By: Elliot GurneyWoody, BSN, RN, CWS, Kim RN, BSN Entered By: Elliot GurneyWoody, BSN, RN, CWS, Kim on 06/13/2017 13:49:14 Fonner, [name] ([id number]) -------------------------------------------------------------------------------- Wound Assessment Details Patient Name: Darryl MorningADAMS, Terez E. Date of Service: 06/13/2017 12:30 PM Medical Record Number: [id number] Patient Account Number: [id number] Date of Birth/Sex: 02/20/1996 (21 y.o. Male) Treating RN: Huel CoventryWoody, Kim Primary Care Lavern Maslow: Franco NonesLINDLEY, CHERYL Other Clinician: Referring Marvella Jenning: Franco NonesLINDLEY, CHERYL Treating Sparkle Aube/Extender: Maxwell CaulOBSON, MICHAEL G Weeks in Treatment:  0 Wound Status Wound Number: 1 Primary Etiology: Dehisced Wound Wound Location: Right Abdomen - Lower Quadrant Secondary Etiology: Open Surgical Wound Wounding Event: Surgical Injury Wound Status:  Open Date Acquired: 04/11/2017 Weeks Of Treatment: 0 Clustered Wound: No Photos Photo Uploaded By: Elliot Gurney, BSN, RN, CWS, Kim on 06/13/2017 14:02:18 Wound Measurements Length: (cm) Width: (cm) Depth: (cm) Area: (cm) Volume: (cm) 0.2 % Reduction in Area: 3.2 % Reduction in Volume: 0.1 0.503 0.05 Wound Description Classification: Partial Thickness Wound Margin: Flat and Intact Exudate Amount: Small Exudate Type: Serous Exudate Color: amber Foul Odor After Cleansing: No Slough/Fibrino No Wound Bed Granulation Amount: Large (67-100%) Exposed Structure Granulation Quality: Pink Fascia Exposed: No Necrotic Amount: None Present (0%) Fat Layer (Subcutaneous Tissue) Exposed: No Tendon Exposed: No Muscle Exposed: No Joint Exposed: No Bone Exposed: No Limited to Skin Breakdown Periwound Skin Texture Texture Color No Abnormalities Noted: No No Abnormalities Noted: No Bilton, Emani E. ([id number]) Callus: No Atrophie Blanche: No Crepitus: No Cyanosis: No Excoriation: No Ecchymosis: No Induration: No Erythema: No Rash: No Hemosiderin Staining: No Scarring: Yes Mottled: No Pallor: No Moisture Rubor: No No Abnormalities Noted: No Dry / Scaly: No Maceration: No Wound Preparation Ulcer Cleansing: Wound Cleanser Topical Anesthetic Applied: Other: Lidocaine 4 %, Treatment Notes Wound #1 (Right Abdomen - Lower Quadrant) 1. Cleansed with: Clean wound with Normal Saline 4. Dressing Applied: Prisma Ag 5. Secondary Dressing Applied Bordered Foam Dressing Electronic Signature(s) Signed: 06/15/2017 5:34:16 PM By: Elliot Gurney, BSN, RN, CWS, Kim RN, BSN Entered By: Elliot Gurney, BSN, RN, CWS, Kim on 06/13/2017 12:57:36 Crapps, [name] ([id number]) -------------------------------------------------------------------------------- Vitals Details Patient Name: Darryl Hickman Date of Service: 06/13/2017 12:30 PM Medical Record Number: [id number] Patient Account Number: [id number] Date  of Birth/Sex: [birth year] (21 y.o. Male) Treating RN: Huel Coventry Primary Care Madine Sarr: Franco Nones Other Clinician: Referring Rayhana Slider: Franco Nones Treating Zyair Russi/Extender: Maxwell Caul Weeks in Treatment: 0 Vital Signs Time Taken: 12:50 Temperature (F): 98.7 Height (in): 61 Pulse (bpm): 105 Source: Stated Respiratory Rate (breaths/min): 18 Weight (lbs): 198 Blood Pressure (mmHg): 114/62 Source: Stated Reference Range: 80 - 120 mg / dl Body Mass Index (BMI): 37.4 Electronic Signature(s) Signed: 06/15/2017 5:34:16 PM By: Elliot Gurney, BSN, RN, CWS, Kim RN, BSN Entered By: Elliot Gurney, BSN, RN, CWS, Kim on 06/13/2017 12:51:53

## 2017-06-17 NOTE — Progress Notes (Signed)
Darryl Hickman, Darryl E. ([id number]) Visit Report for 06/13/2017 Abuse/Suicide Risk Screen Details Patient Name: Darryl Hickman, Darryl E. Date of Service: 06/13/2017 12:30 PM Medical Record Number: [id number] Patient Account Number: [id number] Date of Birth/Sex: 09/14/1995 (21 y.o. Male) Treating RN: Huel CoventryWoody, Kim Primary Care Terriyah Westra: Franco NonesLINDLEY, CHERYL Other Clinician: Referring Devanie Galanti: Franco NonesLINDLEY, CHERYL Treating Ruta Capece/Extender: Maxwell CaulOBSON, MICHAEL G Weeks in Treatment: 0 Abuse/Suicide Risk Screen Items Answer ABUSE/SUICIDE RISK SCREEN: Has anyone close to you tried to hurt or harm you recentlyo No Do you feel uncomfortable with anyone in your familyo No Has anyone forced you do things that you didnot want to doo No Do you have any thoughts of harming yourselfo No Patient displays signs or symptoms of abuse and/or neglect. No Electronic Signature(s) Signed: 06/15/2017 5:34:16 PM By: Elliot GurneyWoody, BSN, RN, CWS, Kim RN, BSN Entered By: Elliot GurneyWoody, BSN, RN, CWS, Kim on 06/13/2017 13:07:53 Gesell, Darryl SickleJOSEPH E. ([id number]) -------------------------------------------------------------------------------- Activities of Daily Living Details Patient Name: Darryl Hickman, Darryl E. Date of Service: 06/13/2017 12:30 PM Medical Record Number: [id number] Patient Account Number: [id number] Date of Birth/Sex: 04/30/1996 (21 y.o. Male) Treating RN: Huel CoventryWoody, Kim Primary Care Yvonnie Schinke: Franco NonesLINDLEY, CHERYL Other Clinician: Referring Miaisabella Bacorn: Franco NonesLINDLEY, CHERYL Treating Rafiel Mecca/Extender: Maxwell CaulOBSON, MICHAEL G Weeks in Treatment: 0 Activities of Daily Living Items Answer Activities of Daily Living (Please select one for each item) Drive Automobile Not Able Take Medications Need Assistance Use Telephone Need Assistance Care for Appearance Need Assistance Use Toilet Need Assistance Bath / Shower Need Assistance Dress Self Need Assistance Feed Self Need Assistance Walk Need Assistance Get In / Out Bed Need Assistance Housework Need Assistance Prepare  Meals Need Assistance Handle Money Need Assistance Shop for Self Need Assistance Electronic Signature(s) Signed: 06/15/2017 5:34:16 PM By: Elliot GurneyWoody, BSN, RN, CWS, Kim RN, BSN Entered By: Elliot GurneyWoody, BSN, RN, CWS, Kim on 06/13/2017 13:09:00 Hickman, Darryl SickleJOSEPH E. ([id number]) -------------------------------------------------------------------------------- Education Assessment Details Patient Name: Darryl Hickman, Darryl E. Date of Service: 06/13/2017 12:30 PM Medical Record Number: [id number] Patient Account Number: [id number] Date of Birth/Sex: 11/19/1995 (21 y.o. Male) Treating RN: Huel CoventryWoody, Kim Primary Care Tanveer Brammer: Franco NonesLINDLEY, CHERYL Other Clinician: Referring Lynnsie Linders: Franco NonesLINDLEY, CHERYL Treating Julis Haubner/Extender: Altamese CarolinaOBSON, MICHAEL G Weeks in Treatment: 0 Learning Preferences/Education Level/Primary Language Highest Education Level: High School Cognitive Barrier Assessment/Beliefs Language Barrier: No Translator Needed: No Memory Deficit: No Emotional Barrier: Yes Physical Barrier Assessment Impaired Vision: No Impaired Hearing: No Decreased Hand dexterity: No Knowledge/Comprehension Assessment Knowledge Level: Low Comprehension Level: Low Ability to understand written Low instructions: Ability to understand verbal Low instructions: Motivation Assessment Anxiety Level: Calm Cooperation: Cooperative Education Importance: Acknowledges Need Interest in Health Problems: Uninterested Willingness to Engage in Self- Low Management Activities: Readiness to Engage in Self- Low Management Activities: Psychologist, prison and probation serviceslectronic Signature(s) Signed: 06/15/2017 5:34:16 PM By: Elliot GurneyWoody, BSN, RN, CWS, Kim RN, BSN Entered By: Elliot GurneyWoody, BSN, RN, CWS, Kim on 06/13/2017 13:10:35 Hickman, Darryl SickleJOSEPH E. ([id number]) -------------------------------------------------------------------------------- Fall Risk Assessment Details Patient Name: Darryl Hickman, Darryl E. Date of Service: 06/13/2017 12:30 PM Medical Record Number: [id number] Patient Account  Number: [id number] Date of Birth/Sex: 12/29/1995 (21 y.o. Male) Treating RN: Huel CoventryWoody, Kim Primary Care Jenaro Souder: Franco NonesLINDLEY, CHERYL Other Clinician: Referring Juanice Warburton: Franco NonesLINDLEY, CHERYL Treating Shamanda Len/Extender: Maxwell CaulOBSON, MICHAEL G Weeks in Treatment: 0 Fall Risk Assessment Items Have you had 2 or more falls in the last 12 monthso 0 No Have you had any fall that resulted in injury in the last 12 monthso 0 No FALL RISK ASSESSMENT: History of falling - immediate or within 3 months 0 No Secondary diagnosis 0 No Ambulatory aid None/bed rest/wheelchair/nurse 0 No Crutches/cane/walker  0 No Furniture 0 No IV Access/Saline Lock 0 No Gait/Training Normal/bed rest/immobile 0 No Weak 0 No Impaired 0 No Mental Status Oriented to own ability 0 No Electronic Signature(s) Signed: 06/15/2017 5:34:16 PM By: Elliot Gurney, BSN, RN, CWS, Kim RN, BSN Entered By: Elliot Gurney, BSN, RN, CWS, Kim on 06/13/2017 13:10:48 Hickman, [name] ([id number]) -------------------------------------------------------------------------------- Foot Assessment Details Patient Name: Darryl Hickman Date of Service: 06/13/2017 12:30 PM Medical Record Number: [id number] Patient Account Number: [id number] Date of Birth/Sex: May 04, 1996 (21 y.o. Male) Treating RN: Huel Coventry Primary Care Richardine Peppers: Franco Nones Other Clinician: Referring Evon Dejarnett: Franco Nones Treating Nilson Tabora/Extender: Maxwell Caul Weeks in Treatment: 0 Foot Assessment Items Site Locations + = Sensation present, - = Sensation absent, C = Callus, U = Ulcer R = Redness, W = Warmth, M = Maceration, PU = Pre-ulcerative lesion F = Fissure, S = Swelling, D = Dryness Assessment Right: Left: Other Deformity: No No Prior Foot Ulcer: No No Prior Amputation: No No Charcot Joint: No No Ambulatory Status: Ambulatory Without Help Gait: Steady Electronic Signature(s) Signed: 06/15/2017 5:34:16 PM By: Elliot Gurney, BSN, RN, CWS, Kim RN, BSN Entered By: Elliot Gurney, BSN, RN, CWS, Kim  on 06/13/2017 13:11:29 Mccreery, [name] ([id number]) -------------------------------------------------------------------------------- Nutrition Risk Assessment Details Patient Name: Darryl Hickman Date of Service: 06/13/2017 12:30 PM Medical Record Number: [id number] Patient Account Number: [id number] Date of Birth/Sex: 09-26-1995 (21 y.o. Male) Treating RN: Huel Coventry Primary Care Catarina Huntley: Franco Nones Other Clinician: Referring Jie Stickels: Franco Nones Treating Crystalyn Delia/Extender: Maxwell Caul Weeks in Treatment: 0 Height (in): 61 Weight (lbs): 198 Body Mass Index (BMI): 37.4 Nutrition Risk Assessment Items NUTRITION RISK SCREEN: I have an illness or condition that made me change the kind and/or amount of 0 No food I eat I eat fewer than two meals per day 0 No I eat few fruits and vegetables, or milk products 0 No I have three or more drinks of beer, liquor or wine almost every day 0 No I have tooth or mouth problems that make it hard for me to eat 0 No I don't always have enough money to buy the food I need 0 No I eat alone most of the time 0 No I take three or more different prescribed or over-the-counter drugs a day 0 No Without wanting to, I have lost or gained 10 pounds in the last six months 0 No I am not always physically able to shop, cook and/or feed myself 0 No Nutrition Protocols Good Risk Protocol 0 No interventions needed Moderate Risk Protocol Electronic Signature(s) Signed: 06/15/2017 5:34:16 PM By: Elliot Gurney, BSN, RN, CWS, Kim RN, BSN Entered By: Elliot Gurney, BSN, RN, CWS, Kim on 06/13/2017 13:11:12

## 2017-06-20 ENCOUNTER — Encounter: Payer: Medicaid Other | Attending: Internal Medicine | Admitting: Internal Medicine

## 2017-06-20 DIAGNOSIS — Y839 Surgical procedure, unspecified as the cause of abnormal reaction of the patient, or of later complication, without mention of misadventure at the time of the procedure: Secondary | ICD-10-CM | POA: Insufficient documentation

## 2017-06-20 DIAGNOSIS — T8131XS Disruption of external operation (surgical) wound, not elsewhere classified, sequela: Secondary | ICD-10-CM | POA: Insufficient documentation

## 2017-06-20 DIAGNOSIS — Q5310 Unspecified undescended testicle, unilateral: Secondary | ICD-10-CM | POA: Diagnosis not present

## 2017-06-20 DIAGNOSIS — E039 Hypothyroidism, unspecified: Secondary | ICD-10-CM | POA: Insufficient documentation

## 2017-06-22 NOTE — Progress Notes (Signed)
Darryl Hickman, Darryl E. ([id number]) Visit Report for 06/20/2017 Debridement Details Patient Name: Darryl Hickman, Darryl E. Date of Service: 06/20/2017 10:15 AM Medical Record Number: [id number] Patient Account Number: [id number] Date of Birth/Sex: 08/21/1995 (20 y.o. Male) Treating RN: Curtis Sitesorthy, Joanna Primary Care Provider: Franco NonesLINDLEY, CHERYL Other Clinician: Referring Provider: Franco NonesLINDLEY, CHERYL Treating Provider/Extender: Altamese CarolinaOBSON, MICHAEL G Weeks in Treatment: 1 Debridement Performed for Wound #1 Right Abdomen - Lower Quadrant Assessment: Performed By: Physician Maxwell CaulOBSON, MICHAEL G, MD Debridement: Open Wound/Selective Debridement Description: Selective Pre-procedure Verification/Time Yes - 10:42 Out Taken: Start Time: 10:42 Pain Control: Lidocaine 4% Topical Solution Level: Non-Viable Tissue Total Area Debrided (L x W): 0.2 (cm) x 2 (cm) = 0.4 (cm) Tissue and other material Non-Viable, Fibrin/Slough debrided: Instrument: Curette Bleeding: None End Time: 10:44 Procedural Pain: 0 Post Procedural Pain: 0 Response to Treatment: Procedure was tolerated well Post Debridement Measurements of Total Wound Length: (cm) 0.2 Width: (cm) 2 Depth: (cm) 0.1 Volume: (cm) 0.031 Character of Wound/Ulcer Post Debridement: Improved Post Procedure Diagnosis Same as Pre-procedure Electronic Signature(s) Signed: 06/20/2017 4:46:00 PM By: Curtis Sitesorthy, Joanna Signed: 06/21/2017 8:07:20 AM By: Baltazar Najjarobson, Michael MD Entered By: Baltazar Najjarobson, Michael on 06/20/2017 10:53:31 [name], Darryl SickleJOSEPH E. ([id number]) -------------------------------------------------------------------------------- HPI Details Patient Name: Darryl Hickman, Darryl E. Date of Service: 06/20/2017 10:15 AM Medical Record Number: [id number] Patient Account Number: [id number] Date of Birth/Sex: 10/24/1995 (20 y.o. Male) Treating RN: Curtis Sitesorthy, Joanna Primary Care Provider: Franco NonesLINDLEY, CHERYL Other Clinician: Referring Provider: Franco NonesLINDLEY, CHERYL Treating Provider/Extender: Maxwell CaulOBSON,  MICHAEL G Weeks in Treatment: 1 History of Present Illness HPI Description: 06/13/17; this is a 21 year old young man who according to his primary care notes has moderate intellectual disability as well as other psychiatric issues. 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 This was done because of an undescended testing on the right. He was seen earlier this month by his primary care provider I believe a swab culture of the nonhealing area revealed MRSA and he was treated with Septra and had topical Bactroban. The patient lives in a group home and they're apparently changing his dressing there. 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 He has morbid obesity. The exact nature of his intellectual issues is unclear from reviewing the records available. The patient is awake does not complain of pain. He has had no fever 06/20/17; surgical wound in the right lower quadrant. The wound itself looks quite satisfactory except for one area that had necrotic debris. This was removed with a #3 curet. Underneath this most of the area looks epithelialized there is only a small open area remaining. There was no bleeding. No evidence of infection Electronic Signature(s) Signed: 06/21/2017 8:07:20 AM By: Baltazar Najjarobson, Michael MD Entered By: Baltazar Najjarobson, Michael on 06/20/2017 10:54:28 Darryl Hickman, Darryl SickleJOSEPH E. ([id number]) -------------------------------------------------------------------------------- Physical Exam Details Patient Name: Darryl Hickman, Darryl E. Date of Service: 06/20/2017 10:15 AM Medical Record Number: [id number] Patient Account Number: [id number] Date of Birth/Sex: 02/02/1996 (20 y.o. Male) Treating RN: Curtis Sitesorthy, Joanna Primary Care Provider: Franco NonesLINDLEY, CHERYL Other Clinician: Referring Provider: Franco NonesLINDLEY, CHERYL Treating Provider/Extender: Maxwell CaulOBSON, MICHAEL  G Weeks in Treatment: 1 Constitutional Sitting or standing Blood Pressure is within target range for patient.. Pulse regular and within target range for patient.Marland Kitchen. Respirations regular, non-labored and within target range.. Temperature is normal and within the target range for the patient.Marland Kitchen. appears in no distress. Respiratory Respiratory effort is easy and symmetric bilaterally. Rate is normal at  rest and on room air.. Notes Wound exam; horizontal incision just above the symphysis pubis on the right. On the right side it is debrided of some necrotic surface material however there is no bleeding. Most of this looks to be progressing towards epithelialization. Although there was apparently MRSA involved with this wound at one point I see no evidence of infection Electronic Signature(s) Signed: 06/21/2017 8:07:20 AM By: Baltazar Najjar MD Entered By: Baltazar Najjar on 06/20/2017 10:56:00 Darryl Hickman, Darryl Hickman ([id number]) -------------------------------------------------------------------------------- Physician Orders Details Patient Name: Darryl Hickman Date of Service: 06/20/2017 10:15 AM Medical Record Number: [id number] Patient Account Number: [id number] Date of Birth/Sex: [birth year] (20 y.o. Male) Treating RN: Curtis Sites Primary Care Provider: Franco Nones Other Clinician: Referring Provider: Franco Nones Treating Provider/Extender: Altamese Mullan in Treatment: 1 Verbal / Phone Orders: No Diagnosis Coding Wound Cleansing Wound #1 Right Abdomen - Lower Quadrant o Clean wound with Normal Saline. Primary Wound Dressing Wound #1 Right Abdomen - Lower Quadrant o Prisma Ag Secondary Dressing o Other - [place] Dressing Change Frequency Wound #1 Right Abdomen - Lower Quadrant o Change dressing every other day. - and as needed Follow-up Appointments Wound #1 Right Abdomen - Lower Quadrant o Return Appointment in 1 week. Electronic Signature(s) Signed:  06/20/2017 4:46:00 PM By: Curtis Sites Signed: 06/21/2017 8:07:20 AM By: Baltazar Najjar MD Entered By: Curtis Sites on 06/20/2017 10:49:34 Darryl Hickman, Darryl Hickman ([id number]) -------------------------------------------------------------------------------- Problem List Details Patient Name: Darryl Hickman Date of Service: 06/20/2017 10:15 AM Medical Record Number: [id number] Patient Account Number: [id number] Date of Birth/Sex: 06/19/1996 (20 y.o. Male) Treating RN: Curtis Sites Primary Care Provider: Franco Nones Other Clinician: Referring Provider: Franco Nones Treating Provider/Extender: Altamese Asotin in Treatment: 1 Active Problems ICD-10 Encounter Code Description Active Date Diagnosis T81.31XS Disruption of external operation (surgical) wound, not elsewhere 06/13/2017 Yes classified, sequela Q53.10 Unspecified undescended testicle, unilateral 06/13/2017 Yes Inactive Problems Resolved Problems Electronic Signature(s) Signed: 06/21/2017 8:07:20 AM By: Baltazar Najjar MD Entered By: Baltazar Najjar on 06/20/2017 10:53:07 Darryl Hickman, Darryl Hickman ([id number]) -------------------------------------------------------------------------------- Progress Note Details Patient Name: Darryl Hickman Date of Service: 06/20/2017 10:15 AM Medical Record Number: [id number] Patient Account Number: [id number] Date of Birth/Sex: 12-Jan-1996 (20 y.o. Male) Treating RN: Curtis Sites Primary Care Provider: Franco Nones Other Clinician: Referring Provider: Franco Nones Treating Provider/Extender: Maxwell Caul Weeks in Treatment: 1 Subjective History of Present Illness (HPI) 06/13/17; this is a 21 year old young man who according to his primary care notes has moderate intellectual disability as well as other psychiatric issues. 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 This was done  because of an undescended testing on the right. He was seen earlier this month by his primary care provider I believe a swab culture of the nonhealing area revealed MRSA and he was treated with Septra and had topical Bactroban. The patient lives in a group home and they're apparently changing his dressing there. 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 He has morbid obesity. The exact nature of his intellectual issues is unclear from reviewing the records available. The patient is awake does not complain of pain. He has had no fever 06/20/17; surgical wound in the right lower quadrant. The wound itself looks quite satisfactory except for one area that had necrotic debris. This was removed with a #3 curet. Underneath this most of the  area looks epithelialized there is only a small open area remaining. There was no bleeding. No evidence of infection Objective Constitutional Sitting or standing Blood Pressure is within target range for patient.. Pulse regular and within target range for patient.Marland Kitchen Respirations regular, non-labored and within target range.. Temperature is normal and within the target range for the patient.Marland Kitchen appears in no distress. Vitals Time Taken: 10:33 AM, Height: 61 in, Weight: 198 lbs, BMI: 37.4, Temperature: 98.6 F, Pulse: 85 bpm, Respiratory Rate: 16 breaths/min, Blood Pressure: 120/49 mmHg. Respiratory Respiratory effort is easy and symmetric bilaterally. Rate is normal at rest and on room air.. General Notes: Wound exam; horizontal incision just above the symphysis pubis on the right. On the right side it is debrided of some necrotic surface material however there is no bleeding. Most of this looks to be progressing towards epithelialization. Although there was apparently MRSA involved with this wound at one point I see no evidence of infection Integumentary (Hair, Skin) Wound #1 status is Open.  Original cause of wound was Surgical Injury. The wound is located on the Right Abdomen - Lower Quadrant. The wound measures 0.2cm length x 2cm width x 0.1cm depth; 0.314cm^2 area and 0.031cm^3 volume. The wound is limited to skin breakdown. There is no tunneling or undermining noted. There is a small amount of serous drainage noted. The wound margin is flat and intact. There is large (67-100%) pink granulation within the wound bed. There is no Darryl Hickman, Darryl E. ([id number]) necrotic tissue within the wound bed. The periwound skin appearance exhibited: Scarring. The periwound skin appearance did not exhibit: Callus, Crepitus, Excoriation, Induration, Rash, Dry/Scaly, Maceration, Atrophie Blanche, Cyanosis, Ecchymosis, Hemosiderin Staining, Mottled, Pallor, Rubor, Erythema. Assessment Active Problems ICD-10 T81.31XS - Disruption of external operation (surgical) wound, not elsewhere classified, sequela Q53.10 - Unspecified undescended testicle, unilateral Procedures Wound #1 Pre-procedure diagnosis of Wound #1 is a Dehisced Wound located on the Right Abdomen - Lower Quadrant . There was a Non-Viable Tissue Open Wound/Selective [phone]) debridement with total area of 0.4 sq cm performed by Maxwell Caul, MD. with the following instrument(s): Curette to remove Non-Viable tissue/material including Fibrin/Slough after achieving pain control using Lidocaine 4% Topical Solution. A time out was conducted at 10:42, prior to the start of the procedure. There was no bleeding. The procedure was tolerated well with a pain level of 0 throughout and a pain level of 0 following the procedure. Post Debridement Measurements: 0.2cm length x 2cm width x 0.1cm depth; 0.031cm^3 volume. Character of Wound/Ulcer Post Debridement is improved. Post procedure Diagnosis Wound #1: Same as Pre-Procedure Plan Wound Cleansing: Wound #1 Right Abdomen - Lower Quadrant: Clean wound with Normal Saline. Primary Wound  Dressing: Wound #1 Right Abdomen - Lower Quadrant: Prisma Ag Secondary Dressing: Other - [place] Dressing Change Frequency: Wound #1 Right Abdomen - Lower Quadrant: Change dressing every other day. - and as needed Follow-up Appointments: Wound #1 Right Abdomen - Lower Quadrant: Return Appointment in 1 week. Darryl Hickman, Darryl Hickman ([id number]) #1 continue with the plasma, [place] #2 a think this should be healed next week Electronic Signature(s) Signed: 06/21/2017 8:07:20 AM By: Baltazar Najjar MD Entered By: Baltazar Najjar on 06/20/2017 10:56:44 Darryl Hickman, Darryl Hickman ([id number]) -------------------------------------------------------------------------------- SuperBill Details Patient Name: Darryl Hickman Date of Service: 06/20/2017 Medical Record Number: [id number] Patient Account Number: [id number] Date of Birth/Sex: [birth year] (20 y.o. Male) Treating RN: Curtis Sites Primary Care Provider: Franco Nones Other Clinician: Referring Provider: Franco Nones Treating Provider/Extender: Maxwell Caul Weeks in Treatment: 1  Diagnosis Coding ICD-10 Codes Code Description T81.31XS Disruption of external operation (surgical) wound, not elsewhere classified, sequela Q53.10 Unspecified undescended testicle, unilateral Facility Procedures CPT4 Code Description: [id number] - DEBRIDE WOUND 1ST 20 SQ CM OR < ICD-10 Diagnosis Description T81.31XS Disruption of external operation (surgical) wound, not elsewher Q53.10 Unspecified undescended testicle, unilateral Modifier: e classified, s Quantity: 1 equela Physician Procedures CPT4 Code Description: [id number] - WC PHYS DEBR WO ANESTH 20 SQ CM ICD-10 Diagnosis Description T81.31XS Disruption of external operation (surgical) wound, not elsewhere Q53.10 Unspecified undescended testicle, unilateral Modifier: classified, s Quantity: 1 equela Electronic Signature(s) Signed: 06/21/2017 8:07:20 AM By: Baltazar Najjarobson, Michael MD Entered By: Baltazar Najjarobson,  Michael on 06/20/2017 10:57:06

## 2017-06-22 NOTE — Progress Notes (Signed)
Darryl Hickman, Jerrian E. ([id number]) Visit Report for 06/20/2017 Arrival Information Details Patient Name: Darryl Hickman, Maclain E. Date of Service: 06/20/2017 10:15 AM Medical Record Number: [id number] Patient Account Number: [id number] Date of Birth/Sex: 10/04/1995 (21 y.o. Male) Treating RN: Curtis Sitesorthy, Joanna Primary Care Filiberto Wamble: Franco NonesLINDLEY, CHERYL Other Clinician: Referring Ken Bonn: Franco NonesLINDLEY, CHERYL Treating Maiko Salais/Extender: Altamese CarolinaOBSON, MICHAEL G Weeks in Treatment: 1 Visit Information History Since Last Visit Added or deleted any medications: No Patient Arrived: Ambulatory Any new allergies or adverse reactions: No Arrival Time: 10:32 Had a fall or experienced change in No Accompanied By: staff activities of daily living that may affect Transfer Assistance: None risk of falls: Patient Identification Verified: Yes Signs or symptoms of abuse/neglect since last visito No Secondary Verification Process Completed: Yes Hospitalized since last visit: No Patient Requires Transmission-Based No Has Dressing in Place as Prescribed: Yes Precautions: Pain Present Now: No Patient Has Alerts: No Electronic Signature(s) Signed: 06/20/2017 4:46:00 PM By: Curtis Sitesorthy, Joanna Entered By: Curtis Sitesorthy, Joanna on 06/20/2017 10:32:26 Shanker, [name] ([id number]) -------------------------------------------------------------------------------- Encounter Discharge Information Details Patient Name: Darryl Hickman, Richad E. Date of Service: 06/20/2017 10:15 AM Medical Record Number: [id number] Patient Account Number: [id number] Date of Birth/Sex: 04/24/1996 (21 y.o. Male) Treating RN: Curtis Sitesorthy, Joanna Primary Care Taeshawn Helfman: Franco NonesLINDLEY, CHERYL Other Clinician: Referring Carie Kapuscinski: Franco NonesLINDLEY, CHERYL Treating Truda Staub/Extender: Altamese CarolinaOBSON, MICHAEL G Weeks in Treatment: 1 Encounter Discharge Information Items Discharge Pain Level: 0 Discharge Condition: Stable Ambulatory Status: Ambulatory Discharge Destination: Home Transportation: Private  Auto Accompanied By: staff Schedule Follow-up Appointment: Yes Medication Reconciliation completed and No provided to Patient/Care Jahmya Onofrio: Provided on Clinical Summary of Care: 06/20/2017 Form Type Recipient Paper Patient JA Electronic Signature(s) Signed: 06/20/2017 4:23:36 PM By: Curtis Sitesorthy, Joanna Entered By: Curtis Sitesorthy, Joanna on 06/20/2017 16:23:35 Imber, [name] ([id number]) -------------------------------------------------------------------------------- Multi Wound Chart Details Patient Name: Darryl Hickman, Lannie E. Date of Service: 06/20/2017 10:15 AM Medical Record Number: [id number] Patient Account Number: [id number] Date of Birth/Sex: 07/25/1996 (21 y.o. Male) Treating RN: Curtis Sitesorthy, Joanna Primary Care Najir Roop: Franco NonesLINDLEY, CHERYL Other Clinician: Referring Derk Doubek: Franco NonesLINDLEY, CHERYL Treating Carlito Bogert/Extender: Maxwell CaulOBSON, MICHAEL G Weeks in Treatment: 1 Vital Signs Height(in): 61 Pulse(bpm): 85 Weight(lbs): 198 Blood Pressure(mmHg): 120/49 Body Mass Index(BMI): 37 Temperature(F): 98.6 Respiratory Rate 16 (breaths/min): Photos: [1:No Photos] [N/A:N/A] Wound Location: [1:Right Abdomen - Lower Quadrant] [N/A:N/A] Wounding Event: [1:Surgical Injury] [N/A:N/A] Primary Etiology: [1:Dehisced Wound] [N/A:N/A] Secondary Etiology: [1:Open Surgical Wound] [N/A:N/A] Date Acquired: [1:04/11/2017] [N/A:N/A] Weeks of Treatment: [1:1] [N/A:N/A] Wound Status: [1:Open] [N/A:N/A] Measurements L x W x D [1:0.2x2x0.1] [N/A:N/A] (cm) Area (cm) : [1:0.314] [N/A:N/A] Volume (cm) : [1:0.031] [N/A:N/A] % Reduction in Area: [1:37.60%] [N/A:N/A] % Reduction in Volume: [1:38.00%] [N/A:N/A] Classification: [1:Partial Thickness] [N/A:N/A] Exudate Amount: [1:Small] [N/A:N/A] Exudate Type: [1:Serous] [N/A:N/A] Exudate Color: [1:amber] [N/A:N/A] Wound Margin: [1:Flat and Intact] [N/A:N/A] Granulation Amount: [1:Large (67-100%)] [N/A:N/A] Granulation Quality: [1:Pink] [N/A:N/A] Necrotic Amount: [1:None  Present (0%)] [N/A:N/A] Exposed Structures: [1:Fascia: No Fat Layer (Subcutaneous Tissue) Exposed: No Tendon: No Muscle: No Joint: No Bone: No Limited to Skin Breakdown] [N/A:N/A] Epithelialization: [1:Large (67-100%)] [N/A:N/A] Debridement: [1:Open Wound/Selective ([id number]) - Selective] [N/A:N/A] Pre-procedure [1:10:42] [N/A:N/A] Verification/Time Out Taken: Pain Control: [1:Lidocaine 4% Topical Solution N/A] Tissue Debrided: Fibrin/Slough N/A N/A Level: Non-Viable Tissue N/A N/A Debridement Area (sq cm): 0.4 N/A N/A Instrument: Curette N/A N/A Bleeding: None N/A N/A Procedural Pain: 0 N/A N/A Post Procedural Pain: 0 N/A N/A Debridement Treatment Procedure was tolerated well N/A N/A Response: Post Debridement 0.2x2x0.1 N/A N/A Measurements L x W x D (cm) Post Debridement Volume: 0.031 N/A N/A (cm) Periwound Skin Texture: Scarring: Yes  N/A N/A Excoriation: No Induration: No Callus: No Crepitus: No Rash: No Periwound Skin Moisture: Maceration: No N/A N/A Dry/Scaly: No Periwound Skin Color: Atrophie Blanche: No N/A N/A Cyanosis: No Ecchymosis: No Erythema: No Hemosiderin Staining: No Mottled: No Pallor: No Rubor: No Tenderness on Palpation: No N/A N/A Wound Preparation: Ulcer Cleansing: Wound N/A N/A Cleanser Topical Anesthetic Applied: Other: Lidocaine 4 % Procedures Performed: Debridement N/A N/A Treatment Notes Electronic Signature(s) Signed: 06/21/2017 8:07:20 AM By: Baltazar Najjar MD Entered By: Baltazar Najjar on 06/20/2017 10:53:18 Valdivia, [name] ([id number]) -------------------------------------------------------------------------------- Multi-Disciplinary Care Plan Details Patient Name: Darryl Hickman Date of Service: 06/20/2017 10:15 AM Medical Record Number: [id number] Patient Account Number: [id number] Date of Birth/Sex: 11-27-1995 (21 y.o. Male) Treating RN: Curtis Sites Primary Care Shanie Mauzy: Franco Nones Other Clinician: Referring  Kynzley Dowson: Franco Nones Treating Sheketa Ende/Extender: Altamese [place] in Treatment: 1 Active Inactive ` Orientation to the Wound Care Program Nursing Diagnoses: Knowledge deficit related to the wound healing center program Goals: Patient/caregiver will verbalize understanding of the Wound Healing Center Program Date Initiated: 06/13/2017 Target Resolution Date: 06/19/2017 Goal Status: Active Interventions: Provide education on orientation to the wound center Notes: ` Wound/Skin Impairment Nursing Diagnoses: Impaired tissue integrity Goals: Ulcer/skin breakdown will have a volume reduction of 30% by week 4 Date Initiated: 06/13/2017 Target Resolution Date: 07/14/2017 Goal Status: Active Interventions: Assess ulceration(s) every visit Treatment Activities: Topical wound management initiated : 06/13/2017 Notes: Electronic Signature(s) Signed: 06/20/2017 4:46:00 PM By: Curtis Sites Entered By: Curtis Sites on 06/20/2017 10:44:41 Schiavi, [name] ([id number]) -------------------------------------------------------------------------------- Pain Assessment Details Patient Name: Darryl Hickman Date of Service: 06/20/2017 10:15 AM Medical Record Number: [id number] Patient Account Number: [id number] Date of Birth/Sex: [birth year] (21 y.o. Male) Treating RN: Curtis Sites Primary Care Dmari Schubring: Franco Nones Other Clinician: Referring Neyla Gauntt: Franco Nones Treating Gaddiel Cullens/Extender: Maxwell Caul Weeks in Treatment: 1 Active Problems Location of Pain Severity and Description of Pain Patient Has Paino No Site Locations Pain Management and Medication Current Pain Management: Notes Topical or injectable lidocaine is offered to patient for acute pain when surgical debridement is performed. If needed, Patient is instructed to use over the counter pain medication for the following 24-48 hours after debridement. Wound care MDs do not prescribed pain  medications. Patient has chronic pain or uncontrolled pain. Patient has been instructed to make an appointment with their Primary Care Physician for pain management. Electronic Signature(s) Signed: 06/20/2017 4:46:00 PM By: Curtis Sites Entered By: Curtis Sites on 06/20/2017 10:32:36 Miyasaki, [name] ([id number]) -------------------------------------------------------------------------------- Patient/Caregiver Education Details Patient Name: Darryl Hickman Date of Service: 06/20/2017 10:15 AM Medical Record Number: [id number] Patient Account Number: [id number] Date of Birth/Gender: 1996/04/14 (21 y.o. Male) Treating RN: Curtis Sites Primary Care Physician: Franco Nones Other Clinician: Referring Physician: Franco Nones Treating Physician/Extender: Altamese [place] in Treatment: 1 Education Assessment Education Provided To: Patient and Caregiver Education Topics Provided Wound/Skin Impairment: Handouts: Other: wound care as ordered Methods: Demonstration, Explain/Verbal Responses: State content correctly Electronic Signature(s) Signed: 06/20/2017 4:46:00 PM By: Curtis Sites Entered By: Curtis Sites on 06/20/2017 16:23:55 Leazer, [name] ([id number]) -------------------------------------------------------------------------------- Wound Assessment Details Patient Name: Darryl Hickman Date of Service: 06/20/2017 10:15 AM Medical Record Number: [id number] Patient Account Number: [id number] Date of Birth/Sex: 1996/03/05 (21 y.o. Male) Treating RN: Curtis Sites Primary Care Jamil Armwood: Franco Nones Other Clinician: Referring Kirkland Figg: Franco Nones Treating Herberta Pickron/Extender: Maxwell Caul Weeks in Treatment: 1 Wound Status Wound Number: 1 Primary Etiology: Dehisced Wound Wound Location: Right Abdomen - Lower Quadrant  Secondary Etiology: Open Surgical Wound Wounding Event: Surgical Injury Wound Status: Open Date Acquired: 04/11/2017 Weeks Of  Treatment: 1 Clustered Wound: No Photos Photo Uploaded By: Curtis Sitesorthy, Joanna on 06/20/2017 16:34:59 Wound Measurements Length: (cm) 0.2 Width: (cm) 2 Depth: (cm) 0.1 Area: (cm) 0.314 Volume: (cm) 0.031 % Reduction in Area: 37.6% % Reduction in Volume: 38% Epithelialization: Large (67-100%) Tunneling: No Undermining: No Wound Description Classification: Partial Thickness Wound Margin: Flat and Intact Exudate Amount: Small Exudate Type: Serous Exudate Color: amber Foul Odor After Cleansing: No Slough/Fibrino No Wound Bed Granulation Amount: Large (67-100%) Exposed Structure Granulation Quality: Pink Fascia Exposed: No Necrotic Amount: None Present (0%) Fat Layer (Subcutaneous Tissue) Exposed: No Tendon Exposed: No Muscle Exposed: No Joint Exposed: No Bone Exposed: No Limited to Skin Breakdown Periwound Skin Texture Deshazo, Eldrige E. ([id number]) Texture Color No Abnormalities Noted: No No Abnormalities Noted: No Callus: No Atrophie Blanche: No Crepitus: No Cyanosis: No Excoriation: No Ecchymosis: No Induration: No Erythema: No Rash: No Hemosiderin Staining: No Scarring: Yes Mottled: No Pallor: No Moisture Rubor: No No Abnormalities Noted: No Dry / Scaly: No Maceration: No Wound Preparation Ulcer Cleansing: Wound Cleanser Topical Anesthetic Applied: Other: Lidocaine 4 %, Treatment Notes Wound #1 (Right Abdomen - Lower Quadrant) 1. Cleansed with: Clean wound with Normal Saline 2. Anesthetic Topical Lidocaine 4% cream to wound bed prior to debridement 4. Dressing Applied: Prisma Ag 5. Secondary Dressing Applied [place] Electronic Signature(s) Signed: 06/20/2017 4:46:00 PM By: Curtis Sitesorthy, Joanna Entered By: Curtis Sitesorthy, Joanna on 06/20/2017 10:44:34 Benko, [name] ([id number]) -------------------------------------------------------------------------------- Vitals Details Patient Name: Darryl Hickman, Tia E. Date of Service: 06/20/2017 10:15 AM Medical  Record Number: [id number] Patient Account Number: [id number] Date of Birth/Sex: 08/28/1995 (21 y.o. Male) Treating RN: Curtis Sitesorthy, Joanna Primary Care Enrika Aguado: Franco NonesLINDLEY, CHERYL Other Clinician: Referring Chaniah Cisse: Franco NonesLINDLEY, CHERYL Treating Julies Carmickle/Extender: Maxwell CaulOBSON, MICHAEL G Weeks in Treatment: 1 Vital Signs Time Taken: 10:33 Temperature (F): 98.6 Height (in): 61 Pulse (bpm): 85 Weight (lbs): 198 Respiratory Rate (breaths/min): 16 Body Mass Index (BMI): 37.4 Blood Pressure (mmHg): 120/49 Reference Range: 80 - 120 mg / dl Electronic Signature(s) Signed: 06/20/2017 4:46:00 PM By: Curtis Sitesorthy, Joanna Entered By: Curtis Sitesorthy, Joanna on 06/20/2017 10:34:22

## 2017-06-27 ENCOUNTER — Ambulatory Visit: Payer: Self-pay | Admitting: Internal Medicine

## 2017-06-28 ENCOUNTER — Encounter: Payer: Medicaid Other | Admitting: Internal Medicine

## 2017-06-28 DIAGNOSIS — Q5310 Unspecified undescended testicle, unilateral: Secondary | ICD-10-CM | POA: Diagnosis not present

## 2017-06-29 NOTE — Progress Notes (Signed)
Darryl Hickman, Romari E. ([id number]) Visit Report for 06/28/2017 Arrival Information Details Patient Name: Darryl Hickman, Jarmal E. Date of Service: 06/28/2017 10:15 AM Medical Record Number: [id number] Patient Account Number: [id number] Date of Birth/Sex: 11/28/1995 (21 y.o. Male) Treating RN: Renne CriglerFlinchum, Cheryl Primary Care Makaiya Geerdes: Franco NonesLINDLEY, CHERYL Other Clinician: Referring Antia Rahal: Franco NonesLINDLEY, CHERYL Treating Taylon Coole/Extender: Altamese CarolinaOBSON, MICHAEL G Weeks in Treatment: 2 Visit Information History Since Last Visit All ordered tests and consults were completed: No Patient Arrived: Ambulatory Added or deleted any medications: No Arrival Time: 10:32 Any new allergies or adverse reactions: No Accompanied By: driver Had a fall or experienced change in No Transfer Assistance: None activities of daily living that may affect Patient Requires Transmission-Based No risk of falls: Precautions: Signs or symptoms of abuse/neglect since last visito No Patient Has Alerts: No Pain Present Now: No Electronic Signature(s) Signed: 06/28/2017 4:44:57 PM By: Renne CriglerFlinchum, Cheryl Entered By: Renne CriglerFlinchum, Cheryl on 06/28/2017 10:33:20 Hamblen, [name] ([id number]) -------------------------------------------------------------------------------- Clinic Level of Care Assessment Details Patient Name: Darryl MorningADAMS, Rahn E. Date of Service: 06/28/2017 10:15 AM Medical Record Number: [id number] Patient Account Number: [id number] Date of Birth/Sex: 05/26/1996 (21 y.o. Male) Treating RN: Renne CriglerFlinchum, Cheryl Primary Care Declan Mier: Franco NonesLINDLEY, CHERYL Other Clinician: Referring Gray Maugeri: Franco NonesLINDLEY, CHERYL Treating Keniah Klemmer/Extender: Altamese CarolinaOBSON, MICHAEL G Weeks in Treatment: 2 Clinic Level of Care Assessment Items TOOL 4 Quantity Score []  - Use when only an EandM is performed on FOLLOW-UP visit 0 ASSESSMENTS - Nursing Assessment / Reassessment []  - Reassessment of Co-morbidities (includes updates in patient status) 0 X- 1 5 Reassessment of Adherence  to Treatment Plan ASSESSMENTS - Wound and Skin Assessment / Reassessment X - Simple Wound Assessment / Reassessment - one wound 1 5 []  - 0 Complex Wound Assessment / Reassessment - multiple wounds []  - 0 Dermatologic / Skin Assessment (not related to wound area) ASSESSMENTS - Focused Assessment []  - Circumferential Edema Measurements - multi extremities 0 []  - 0 Nutritional Assessment / Counseling / Intervention []  - 0 Lower Extremity Assessment (monofilament, tuning fork, pulses) []  - 0 Peripheral Arterial Disease Assessment (using hand held doppler) ASSESSMENTS - Ostomy and/or Continence Assessment and Care []  - Incontinence Assessment and Management 0 []  - 0 Ostomy Care Assessment and Management (repouching, etc.) PROCESS - Coordination of Care X - Simple Patient / Family Education for ongoing care 1 15 []  - 0 Complex (extensive) Patient / Family Education for ongoing care []  - 0 Staff obtains ChiropractorConsents, Records, Test Results / Process Orders []  - 0 Staff telephones HHA, Nursing Homes / Clarify orders / etc []  - 0 Routine Transfer to another Facility (non-emergent condition) []  - 0 Routine Hospital Admission (non-emergent condition) []  - 0 New Admissions / Manufacturing engineernsurance Authorizations / Ordering NPWT, Apligraf, etc. []  - 0 Emergency Hospital Admission (emergent condition) X- 1 10 Simple Discharge Coordination Darryl Hickman, Vasilios E. ([id number]) []  - 0 Complex (extensive) Discharge Coordination PROCESS - Special Needs []  - Pediatric / Minor Patient Management 0 []  - 0 Isolation Patient Management []  - 0 Hearing / Language / Visual special needs []  - 0 Assessment of Community assistance (transportation, D/C planning, etc.) []  - 0 Additional assistance / Altered mentation []  - 0 Support Surface(s) Assessment (bed, cushion, seat, etc.) INTERVENTIONS - Wound Cleansing / Measurement []  - Simple Wound Cleansing - one wound 0 []  - 0 Complex Wound Cleansing - multiple wounds X-  1 5 Wound Imaging (photographs - any number of wounds) []  - 0 Wound Tracing (instead of photographs) []  - 0 Simple Wound Measurement - one wound []  -  0 Complex Wound Measurement - multiple wounds INTERVENTIONS - Wound Dressings []  - Small Wound Dressing one or multiple wounds 0 []  - 0 Medium Wound Dressing one or multiple wounds []  - 0 Large Wound Dressing one or multiple wounds []  - 0 Application of Medications - topical []  - 0 Application of Medications - injection INTERVENTIONS - Miscellaneous []  - External ear exam 0 []  - 0 Specimen Collection (cultures, biopsies, blood, body fluids, etc.) []  - 0 Specimen(s) / Culture(s) sent or taken to Lab for analysis []  - 0 Patient Transfer (multiple staff / Michiel Sites Lift / Similar devices) []  - 0 Simple Staple / Suture removal (25 or less) []  - 0 Complex Staple / Suture removal (26 or more) []  - 0 Hypo / Hyperglycemic Management (close monitor of Blood Glucose) []  - 0 Ankle / Brachial Index (ABI) - do not check if billed separately X- 1 5 Vital Signs Pollitt, Carzell E. ([id number]) Has the patient been seen at the hospital within the last three years: Yes Total Score: 45 Level Of Care: New/Established - Level 2 Electronic Signature(s) Signed: 06/28/2017 4:44:57 PM By: Renne Crigler Entered By: Renne Crigler on 06/28/2017 10:47:30 Woodcox, [name] ([id number]) -------------------------------------------------------------------------------- Encounter Discharge Information Details Patient Name: Darryl Morning Date of Service: 06/28/2017 10:15 AM Medical Record Number: [id number] Patient Account Number: [id number] Date of Birth/Sex: Oct 10, 1995 (21 y.o. Male) Treating RN: Renne Crigler Primary Care Domonik Levario: Franco Nones Other Clinician: Referring Raela Bohl: Franco Nones Treating Skyley Grandmaison/Extender: Altamese Cosmos in Treatment: 2 Encounter Discharge Information Items Discharge Pain Level: 0 Discharge  Condition: Stable Ambulatory Status: Ambulatory Discharge Destination: Home Transportation: Private Auto Accompanied By: driver Schedule Follow-up Appointment: No Medication Reconciliation completed and No provided to Patient/Care Shloka Baldridge: Provided on Clinical Summary of Care: 06/28/2017 Form Type Recipient Paper Patient JA Electronic Signature(s) Signed: 06/28/2017 4:27:25 PM By: Gwenlyn Perking Entered By: Gwenlyn Perking on 06/28/2017 11:35:06 Frankum, [name] ([id number]) -------------------------------------------------------------------------------- Lower Extremity Assessment Details Patient Name: Darryl Morning Date of Service: 06/28/2017 10:15 AM Medical Record Number: [id number] Patient Account Number: [id number] Date of Birth/Sex: 07-21-1996 (21 y.o. Male) Treating RN: Renne Crigler Primary Care Hafiz Irion: Franco Nones Other Clinician: Referring Natasa Stigall: Franco Nones Treating Adore Kithcart/Extender: Maxwell Caul Weeks in Treatment: 2 Electronic Signature(s) Signed: 06/28/2017 4:44:57 PM By: Renne Crigler Entered By: Renne Crigler on 06/28/2017 10:43:52 Eastridge, [name] ([id number]) -------------------------------------------------------------------------------- Multi Wound Chart Details Patient Name: Darryl Morning Date of Service: 06/28/2017 10:15 AM Medical Record Number: [id number] Patient Account Number: [id number] Date of Birth/Sex: [birth year] (21 y.o. Male) Treating RN: Renne Crigler Primary Care Izaias Krupka: Franco Nones Other Clinician: Referring Lamoine Magallon: Franco Nones Treating Perle Gibbon/Extender: Maxwell Caul Weeks in Treatment: 2 Vital Signs Height(in): 61 Pulse(bpm): 100 Weight(lbs): 198 Blood Pressure(mmHg): 135/87 Body Mass Index(BMI): 37 Temperature(F): 98.1 Respiratory Rate 16 (breaths/min): Photos: [N/A:N/A] Wound Location: Right Abdomen - Lower N/A N/A Quadrant Wounding Event: Surgical Injury N/A N/A Primary  Etiology: Dehisced Wound N/A N/A Secondary Etiology: Open Surgical Wound N/A N/A Date Acquired: 04/11/2017 N/A N/A Weeks of Treatment: 2 N/A N/A Wound Status: Healed - Epithelialized N/A N/A Measurements L x W x D 0x0x0 N/A N/A (cm) Area (cm) : 0 N/A N/A Volume (cm) : 0 N/A N/A % Reduction in Area: 100.00% N/A N/A % Reduction in Volume: 100.00% N/A N/A Classification: Partial Thickness N/A N/A Exudate Amount: None Present N/A N/A Wound Margin: Flat and Intact N/A N/A Granulation Amount: None Present (0%) N/A N/A Necrotic Amount: None Present (0%) N/A N/A Exposed Structures:  Fascia: No N/A N/A Fat Layer (Subcutaneous Tissue) Exposed: No Tendon: No Muscle: No Joint: No Bone: No Limited to Skin Breakdown Epithelialization: None N/A N/A Periwound Skin Texture: Scarring: Yes N/A N/A Excoriation: No Induration: No Taha, Ramadan E. ([id number]) Callus: No Crepitus: No Rash: No Periwound Skin Moisture: Maceration: No N/A N/A Dry/Scaly: No Periwound Skin Color: Atrophie Blanche: No N/A N/A Cyanosis: No Ecchymosis: No Erythema: No Hemosiderin Staining: No Mottled: No Pallor: No Rubor: No Tenderness on Palpation: No N/A N/A Treatment Notes Electronic Signature(s) Signed: 06/28/2017 4:36:42 PM By: Baltazar Najjarobson, Michael MD Entered By: Baltazar Najjarobson, Michael on 06/28/2017 10:46:24 Ohanian, [name] ([id number]) -------------------------------------------------------------------------------- Multi-Disciplinary Care Plan Details Patient Name: Darryl MorningADAMS, Caid E. Date of Service: 06/28/2017 10:15 AM Medical Record Number: [id number] Patient Account Number: [id number] Date of Birth/Sex: 08/19/1995 (21 y.o. Male) Treating RN: Renne CriglerFlinchum, Cheryl Primary Care Amiliah Campisi: Franco NonesLINDLEY, CHERYL Other Clinician: Referring Morell Mears: Franco NonesLINDLEY, CHERYL Treating Maraki Macquarrie/Extender: Maxwell CaulOBSON, MICHAEL G Weeks in Treatment: 2 Active Inactive Electronic Signature(s) Signed: 06/28/2017 4:44:57 PM By: Renne CriglerFlinchum,  Cheryl Entered By: Renne CriglerFlinchum, Cheryl on 06/28/2017 10:44:24 Fant, [name] ([id number]) -------------------------------------------------------------------------------- Pain Assessment Details Patient Name: Darryl MorningADAMS, Zeno E. Date of Service: 06/28/2017 10:15 AM Medical Record Number: [id number] Patient Account Number: [id number] Date of Birth/Sex: 03/16/1996 (21 y.o. Male) Treating RN: Renne CriglerFlinchum, Cheryl Primary Care Mykaela Arena: Franco NonesLINDLEY, CHERYL Other Clinician: Referring Tityana Pagan: Franco NonesLINDLEY, CHERYL Treating Jaleia Hanke/Extender: Maxwell CaulOBSON, MICHAEL G Weeks in Treatment: 2 Active Problems Location of Pain Severity and Description of Pain Patient Has Paino No Site Locations Pain Management and Medication Current Pain Management: Electronic Signature(s) Signed: 06/28/2017 4:44:57 PM By: Renne CriglerFlinchum, Cheryl Entered By: Renne CriglerFlinchum, Cheryl on 06/28/2017 10:33:36 Gal, [name] ([id number]) -------------------------------------------------------------------------------- Patient/Caregiver Education Details Patient Name: Darryl MorningADAMS, Alric E. Date of Service: 06/28/2017 10:15 AM Medical Record Number: [id number] Patient Account Number: [id number] Date of Birth/Gender: 10/14/1995 (21 y.o. Male) Treating RN: Renne CriglerFlinchum, Cheryl Primary Care Physician: Franco NonesLINDLEY, CHERYL Other Clinician: Referring Physician: Franco NonesLINDLEY, CHERYL Treating Physician/Extender: Altamese CarolinaOBSON, MICHAEL G Weeks in Treatment: 2 Education Assessment Education Provided To: Patient Education Topics Provided Basic Hygiene: Handouts: Other: use moisturizing lotion daily Methods: Explain/Verbal Responses: State content correctly Electronic Signature(s) Signed: 06/28/2017 4:44:57 PM By: Renne CriglerFlinchum, Cheryl Entered By: Renne CriglerFlinchum, Cheryl on 06/28/2017 10:49:47 Kilgour, [name] ([id number]) -------------------------------------------------------------------------------- Wound Assessment Details Patient Name: Darryl MorningADAMS, Cavion E. Date of Service: 06/28/2017 10:15  AM Medical Record Number: [id number] Patient Account Number: [id number] Date of Birth/Sex: 03/08/1996 (21 y.o. Male) Treating RN: Renne CriglerFlinchum, Cheryl Primary Care Jaedyn Lard: Franco NonesLINDLEY, CHERYL Other Clinician: Referring Kristene Liberati: Franco NonesLINDLEY, CHERYL Treating Travon Crochet/Extender: Maxwell CaulOBSON, MICHAEL G Weeks in Treatment: 2 Wound Status Wound Number: 1 Primary Etiology: Dehisced Wound Wound Location: Right Abdomen - Lower Quadrant Secondary Etiology: Open Surgical Wound Wounding Event: Surgical Injury Wound Status: Healed - Epithelialized Date Acquired: 04/11/2017 Weeks Of Treatment: 2 Clustered Wound: No Photos Wound Measurements Length: (cm) 0 % Re Width: (cm) 0 % Re Depth: (cm) 0 Epit Area: (cm) 0 Volume: (cm) 0 duction in Area: 100% duction in Volume: 100% helialization: None Wound Description Classification: Partial Thickness Foul Wound Margin: Flat and Intact Slou Exudate Amount: None Present Odor After Cleansing: No gh/Fibrino No Wound Bed Granulation Amount: None Present (0%) Exposed Structure Necrotic Amount: None Present (0%) Fascia Exposed: No Fat Layer (Subcutaneous Tissue) Exposed: No Tendon Exposed: No Muscle Exposed: No Joint Exposed: No Bone Exposed: No Limited to Skin Breakdown Periwound Skin Texture Texture Color No Abnormalities Noted: No No Abnormalities Noted: No Callus: No Atrophie Blanche: No Crepitus: No Cyanosis: No Excoriation: No Ecchymosis: No Sharman CheekDAMS, Laydon E. ([id number]) Induration:  No Erythema: No Rash: No Hemosiderin Staining: No Scarring: Yes Mottled: No Pallor: No Moisture Rubor: No No Abnormalities Noted: No Dry / Scaly: No Maceration: No Electronic Signature(s) Signed: 06/28/2017 4:44:57 PM By: Renne Crigler Entered By: Renne Crigler on 06/28/2017 10:43:19 Leichter, [name] ([id number]) -------------------------------------------------------------------------------- Vitals Details Patient Name: Darryl Morning Date of  Service: 06/28/2017 10:15 AM Medical Record Number: [id number] Patient Account Number: [id number] Date of Birth/Sex: December 04, 1995 (21 y.o. Male) Treating RN: Renne Crigler Primary Care Riese Hellard: Franco Nones Other Clinician: Referring Scarlett Portlock: Franco Nones Treating Karsen Nakanishi/Extender: Maxwell Caul Weeks in Treatment: 2 Vital Signs Time Taken: 10:33 Temperature (F): 98.1 Height (in): 61 Pulse (bpm): 100 Weight (lbs): 198 Respiratory Rate (breaths/min): 16 Body Mass Index (BMI): 37.4 Blood Pressure (mmHg): 135/87 Reference Range: 80 - 120 mg / dl Electronic Signature(s) Signed: 06/28/2017 4:44:57 PM By: Renne Crigler Entered By: Renne Crigler on 06/28/2017 10:34:16

## 2017-06-29 NOTE — Progress Notes (Signed)
Darryl Hickman, Ahmad E. ([id number]) Visit Report for 06/28/2017 HPI Details Patient Name: Darryl Hickman, Darryl E. Date of Service: 06/28/2017 10:15 AM Medical Record Number: [id number] Patient Account Number: [id number] Date of Birth/Sex: 05/16/1996 (21 y.o. Male) Treating RN: Huel CoventryWoody, Kim Primary Care Provider: Franco NonesLINDLEY, CHERYL Other Clinician: Referring Provider: Franco NonesLINDLEY, CHERYL Treating Provider/Extender: Maxwell CaulOBSON, Kavonte Bearse G Weeks in Treatment: 2 History of Present Illness HPI Description: 06/13/17; this is a 21 year old young man who according to his primary care notes has moderate intellectual disability as well as other psychiatric issues. 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 This was done because of an undescended testing on the right. He was seen earlier this month by his primary care provider I believe a swab culture of the nonhealing area revealed MRSA and he was treated with Septra and had topical Bactroban. The patient lives in a group home and they're apparently changing his dressing there. 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 He has morbid obesity. The exact nature of his intellectual issues is unclear from reviewing the records available. The patient is awake does not complain of pain. He has had no fever 06/20/17; surgical wound in the right lower quadrant. The wound itself looks quite satisfactory except for one area that had necrotic debris. This was removed with a #3 curet. Underneath this most of the area looks epithelialized there is only a small open area remaining. There was no bleeding. No evidence of infection 06/28/17; surgical wound in the right lower quadrant wound itself is essentially closed over today there is some surface slough though I did not debride it. Apparently he is been scratching incessantly at the  dressings with him putting on I don't think any dressing per se is necessary now Electronic Signature(s) Signed: 06/28/2017 4:36:42 PM By: Baltazar Najjarobson, Aoi Kouns MD Entered By: Baltazar Najjarobson, Kenzi Bardwell on 06/28/2017 10:48:04 Burby, Darryl SickleJOSEPH E. ([id number]) -------------------------------------------------------------------------------- Physical Exam Details Patient Name: Darryl Hickman, Darryl E. Date of Service: 06/28/2017 10:15 AM Medical Record Number: [id number] Patient Account Number: [id number] Date of Birth/Sex: 12/01/1995 (21 y.o. Male) Treating RN: Huel CoventryWoody, Kim Primary Care Provider: Franco NonesLINDLEY, CHERYL Other Clinician: Referring Provider: Franco NonesLINDLEY, CHERYL Treating Provider/Extender: Maxwell CaulOBSON, Bonnetta Allbee G Weeks in Treatment: 2 Constitutional Sitting or standing Blood Pressure is within target range for patient.. Pulse regular and within target range for patient.Marland Kitchen. Respirations regular, non-labored and within target range.. Temperature is normal and within the target range for the patient.Marland Kitchen. appears in no distress. Notes Wound exam; the areas in the right lower quadrant. No debrided management as necessary. This appears to be satisfactorily closed at this point no evidence of surrounding infection Electronic Signature(s) Signed: 06/28/2017 4:36:42 PM By: Baltazar Najjarobson, Tiawanna Luchsinger MD Entered By: Baltazar Najjarobson, Cambell Stanek on 06/28/2017 10:49:17 Kaeding, Darryl SickleJOSEPH E. ([id number]) -------------------------------------------------------------------------------- Physician Orders Details Patient Name: Darryl Hickman, Darryl E. Date of Service: 06/28/2017 10:15 AM Medical Record Number: [id number] Patient Account Number: [id number] Date of Birth/Sex: 07/24/1996 (21 y.o. Male) Treating RN: Renne CriglerFlinchum, Cheryl Primary Care Provider: Franco NonesLINDLEY, CHERYL Other Clinician: Referring Provider: Franco NonesLINDLEY, CHERYL Treating Provider/Extender: Altamese CarolinaOBSON, Miral Hoopes G Weeks in Treatment: 2 Verbal / Phone Orders: No Diagnosis Coding Skin Barriers/Peri-Wound Care o Moisturizing  lotion [facility] Discharge from [facility] Center - treatment complete Electronic Signature(s) Signed: 06/28/2017 4:36:42 PM By: Baltazar Najjarobson, Darris Staiger MD Signed: 06/28/2017 4:44:57 PM By: Renne CriglerFlinchum, Cheryl Entered By: Renne CriglerFlinchum, Cheryl on 06/28/2017 10:45:51 Delavega, Darryl SickleJOSEPH E. ([id number]) --------------------------------------------------------------------------------  Problem List Details Patient Name: Darryl Hickman, Darryl Hickman Date of Service: 06/28/2017 10:15 AM Medical Record Number: [id number] Patient Account Number: [id number] Date of Birth/Sex: May 25, 1996 (21 y.o. Male) Treating RN: Huel Coventry Primary Care Provider: Franco Nones Other Clinician: Referring Provider: Franco Nones Treating Provider/Extender: Altamese [place] in Treatment: 2 Active Problems ICD-10 Encounter Code Description Active Date Diagnosis T81.31XS Disruption of external operation (surgical) wound, not elsewhere 06/13/2017 Yes classified, sequela Q53.10 Unspecified undescended testicle, unilateral 06/13/2017 Yes Inactive Problems Resolved Problems Electronic Signature(s) Signed: 06/28/2017 4:36:42 PM By: Baltazar Najjar MD Entered By: Baltazar Najjar on 06/28/2017 10:46:02 Grisanti, [name] ([id number]) -------------------------------------------------------------------------------- Progress Note Details Patient Name: Darryl Hickman Date of Service: 06/28/2017 10:15 AM Medical Record Number: [id number] Patient Account Number: [id number] Date of Birth/Sex: 10-18-1995 (21 y.o. Male) Treating RN: Huel Coventry Primary Care Provider: Franco Nones Other Clinician: Referring Provider: Franco Nones Treating Provider/Extender: Maxwell Caul Weeks in Treatment: 2 Subjective History of Present Illness (HPI) 06/13/17; this is a 21 year old young man who according to his primary care notes has moderate intellectual disability as well as other psychiatric issues. He had an orchiectomy done  in November 2017 and it appears that he has a nonhealing wound just above his symphysis pubis on the right. This was done because of an undescended testing on the right. He was seen earlier this month by his primary care provider I believe a swab culture of the nonhealing area revealed MRSA and he was treated with Septra and had topical Bactroban. The patient lives in a group home and they're apparently changing his dressing there. 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 He has morbid obesity. The exact nature of his intellectual issues is unclear from reviewing the records available. The patient is awake does not complain of pain. He has had no fever 06/20/17; surgical wound in the right lower quadrant. The wound itself looks quite satisfactory except for one area that had necrotic debris. This was removed with a #3 curet. Underneath this most of the area looks epithelialized there is only a small open area remaining. There was no bleeding. No evidence of infection 06/28/17; surgical wound in the right lower quadrant wound itself is essentially closed over today there is some surface slough though I did not debride it. Apparently he is been scratching incessantly at the dressings with him putting on I don't think any dressing per se is necessary now Objective Constitutional Sitting or standing Blood Pressure is within target range for patient.. Pulse regular and within target range for patient.Marland Kitchen Respirations regular, non-labored and within target range.. Temperature is normal and within the target range for the patient.Marland Kitchen appears in no distress. Vitals Time Taken: 10:33 AM, Height: 61 in, Weight: 198 lbs, BMI: 37.4, Temperature: 98.1 F, Pulse: 100 bpm, Respiratory Rate: 16 breaths/min, Blood Pressure: 135/87 mmHg. General Notes: Wound exam; the areas in the right lower quadrant. No debrided management as necessary. This  appears to be satisfactorily closed at this point no evidence of surrounding infection Integumentary (Hair, Skin) Wound #1 status is Healed - Epithelialized. Original cause of wound was Surgical Injury. The wound is located on the Right Abdomen - Lower Quadrant. The wound measures 0cm length x 0cm width x 0cm depth; 0cm^2 area and 0cm^3 volume. The wound is limited to skin breakdown. There is a none present amount of drainage noted. The wound margin is flat and intact. There is no granulation within the wound bed.  There is no necrotic tissue within the wound bed. The periwound skin appearance exhibited: Scarring. The periwound skin appearance did not exhibit: Callus, Crepitus, Excoriation, Induration, Darryl Hickman, Darryl E. ([id number]) Rash, Dry/Scaly, Maceration, Atrophie Blanche, Cyanosis, Ecchymosis, Hemosiderin Staining, Mottled, Pallor, Rubor, Erythema. Assessment Active Problems ICD-10 T81.31XS - Disruption of external operation (surgical) wound, not elsewhere classified, sequela Q53.10 - Unspecified undescended testicle, unilateral Plan Skin Barriers/Peri-Wound Care: Moisturizing lotion [facility] - treatment complete o I think the patient can be discharged from the clinic. This was initially a surgical wound. Other then moisturizing cream to the area I don't think any secondary prevention is necessary Electronic Signature(s) Signed: 06/28/2017 4:36:42 PM By: Baltazar Najjarobson, Sanvika Cuttino MD Entered By: Baltazar Najjarobson, Arless Vineyard on 06/28/2017 10:50:48 Wooldridge, Darryl SickleJOSEPH E. ([id number]) -------------------------------------------------------------------------------- SuperBill Details Patient Name: Darryl Hickman, Darryl E. Date of Service: 06/28/2017 Medical Record Number: [id number] Patient Account Number: [id number] Date of Birth/Sex: 09/12/1995 (21 y.o. Male) Treating RN: Huel CoventryWoody, Kim Primary Care Provider: Franco NonesLINDLEY, CHERYL Other Clinician: Referring Provider: Franco NonesLINDLEY,  CHERYL Treating Provider/Extender: Maxwell CaulOBSON, Nealie Mchatton G Weeks in Treatment: 2 Diagnosis Coding ICD-10 Codes Code Description T81.31XS Disruption of external operation (surgical) wound, not elsewhere classified, sequela Q53.10 Unspecified undescended testicle, unilateral Facility Procedures CPT4 Code: [id number] Description: [id number] - WOUND CARE VISIT-LEV 2 EST PT Modifier: Quantity: 1 Physician Procedures CPT4 Code Description: [id number] - WC PHYS LEVEL 2 - EST PT ICD-10 Diagnosis Description T81.31XS Disruption of external operation (surgical) wound, not elsewhe Q53.10 Unspecified undescended testicle, unilateral Modifier: re classified, s Quantity: 1 equela Electronic Signature(s) Signed: 06/28/2017 4:36:42 PM By: Baltazar Najjarobson, Kristena Wilhelmi MD Entered By: Baltazar Najjarobson, Montrae Braithwaite on 06/28/2017 10:51:18

## 2019-02-09 ENCOUNTER — Other Ambulatory Visit: Payer: Self-pay | Admitting: *Deleted

## 2019-02-09 DIAGNOSIS — Z20822 Contact with and (suspected) exposure to covid-19: Secondary | ICD-10-CM

## 2019-02-15 LAB — NOVEL CORONAVIRUS, NAA: SARS-CoV-2, NAA: NOT DETECTED

## 2019-02-18 ENCOUNTER — Telehealth: Payer: Self-pay | Admitting: *Deleted

## 2019-02-18 NOTE — Telephone Encounter (Signed)
Marietta Walker- nurse at Group Home- Calling for patient results.  Patient is with her and each patient identified his/herself with name and birthday and was given direct results. negative for COVID .  

## 2020-07-28 ENCOUNTER — Emergency Department: Payer: Medicaid Other

## 2020-07-28 ENCOUNTER — Emergency Department
Admission: EM | Admit: 2020-07-28 | Discharge: 2020-07-28 | Disposition: A | Discharge status: Home / Self Care | Payer: Medicaid Other | Attending: Emergency Medicine | Admitting: Emergency Medicine

## 2020-07-28 ENCOUNTER — Other Ambulatory Visit: Payer: Self-pay

## 2020-07-28 DIAGNOSIS — Z20822 Contact with and (suspected) exposure to covid-19: Secondary | ICD-10-CM | POA: Diagnosis not present

## 2020-07-28 DIAGNOSIS — R569 Unspecified convulsions: Secondary | ICD-10-CM | POA: Insufficient documentation

## 2020-07-28 DIAGNOSIS — J45909 Unspecified asthma, uncomplicated: Secondary | ICD-10-CM | POA: Insufficient documentation

## 2020-07-28 LAB — URINE DRUG SCREEN, QUALITATIVE (ARMC ONLY)
Amphetamines, Ur Screen: POSITIVE — AB
Barbiturates, Ur Screen: NOT DETECTED
Benzodiazepine, Ur Scrn: NOT DETECTED
Cannabinoid 50 Ng, Ur ~~LOC~~: NOT DETECTED
Cocaine Metabolite,Ur ~~LOC~~: NOT DETECTED
MDMA (Ecstasy)Ur Screen: NOT DETECTED
Methadone Scn, Ur: NOT DETECTED
Opiate, Ur Screen: NOT DETECTED
Phencyclidine (PCP) Ur S: NOT DETECTED
Tricyclic, Ur Screen: NOT DETECTED

## 2020-07-28 LAB — BASIC METABOLIC PANEL
Anion gap: 10 (ref 5–15)
BUN: 12 mg/dL (ref 6–20)
CO2: 25 mmol/L (ref 22–32)
Calcium: 8.9 mg/dL (ref 8.9–10.3)
Chloride: 102 mmol/L (ref 98–111)
Creatinine, Ser: 0.9 mg/dL (ref 0.61–1.24)
GFR, Estimated: >60 mL/min (ref >60)
Glucose, Bld: 111 mg/dL — ABNORMAL HIGH (ref 70–99)
Potassium: 3.7 mmol/L (ref 3.5–5.1)
Sodium: 137 mmol/L (ref 135–145)

## 2020-07-28 LAB — CBC
HCT: 41.8 % (ref 39.0–52.0)
Hemoglobin: 13.8 g/dL (ref 13.0–17.0)
MCH: 29.8 pg (ref 26.0–34.0)
MCHC: 33 g/dL (ref 30.0–36.0)
MCV: 90.3 fL (ref 80.0–100.0)
Platelets: 142 10*3/uL — ABNORMAL LOW (ref 150–400)
RBC: 4.63 MIL/uL (ref 4.22–5.81)
RDW: 12.5 % (ref 11.5–15.5)
WBC: 7.1 10*3/uL (ref 4.0–10.5)
nRBC: 0 % (ref 0.0–0.2)

## 2020-07-28 LAB — RESP PANEL BY RT-PCR (FLU A&B, COVID) ARPGX2
Influenza A by PCR: NEGATIVE
Influenza B by PCR: NEGATIVE
SARS Coronavirus 2 by RT PCR: NEGATIVE

## 2020-07-28 LAB — URINALYSIS, COMPLETE (UACMP) WITH MICROSCOPIC
Bacteria, UA: NONE SEEN
Bilirubin Urine: NEGATIVE
Glucose, UA: NEGATIVE mg/dL
Hgb urine dipstick: NEGATIVE
Ketones, ur: 5 mg/dL — AB
Leukocytes,Ua: NEGATIVE
Nitrite: NEGATIVE
Protein, ur: NEGATIVE mg/dL
Specific Gravity, Urine: 1.01 (ref 1.005–1.030)
Squamous Epithelial / HPF: NONE SEEN (ref 0–5)
pH: 8 (ref 5.0–8.0)

## 2020-07-28 LAB — CBG MONITORING, ED: Glucose-Capillary: 105 mg/dL — ABNORMAL HIGH (ref 70–99)

## 2020-07-28 LAB — ETHANOL: Alcohol, Ethyl (B): <10 mg/dL (ref <10)

## 2020-07-28 NOTE — ED Notes (Signed)
Pt to CT

## 2020-07-28 NOTE — ED Notes (Signed)
Pt now speaking in words that are easily understood.

## 2020-07-28 NOTE — ED Notes (Signed)
Called legal guardian to inform pt is in ED. He said we can call him back if we need any consents to be signed, etc.

## 2020-07-28 NOTE — ED Notes (Signed)
Provided tissues, pillow and blanket.

## 2020-07-28 NOTE — ED Notes (Signed)
Informed pt that person is on the way to pick him up. Explained that he still needs to wait until they arrive.

## 2020-07-28 NOTE — ED Notes (Signed)
Left msg with [place] [facility] to find current legal guardian to notify of pt arrival to ED.

## 2020-07-28 NOTE — ED Triage Notes (Signed)
Pt to ED via [facility] from [facility] where he was in class and had a witnessed focal seizure of R hand lasting 30 sec. Pt was getting up to bathroom stated felt dizzy and was lowered to floor. Pt has no hx seizures. CBG was not obtained by EMS. Pt has developmental delay. CBG in ED is 105. Pt not responding verbally but does open eyes when asked and nod to questions.

## 2020-07-28 NOTE — ED Notes (Signed)
This RN called group home number that [facility] had charted. She stated "I'll tell Clara." awaiting ride for pt at this time.

## 2020-07-28 NOTE — ED Notes (Signed)
Pt unable to sign for discharge. Legal guardian was contacted regarding discharge but was not present at time of discharge.

## 2020-07-28 NOTE — ED Notes (Addendum)
Worker from group home came and stayed at bedside for about 30 min. She states that pt lives at [facility] [address] in [place]. Phone number [phone]. Administrator is [employer], [phone]. Group home personnel has to leave to see another pt who is in hospital.

## 2020-07-28 NOTE — ED Notes (Addendum)
Nurse from [facility] called back. She provided name and phone number for legal guardian.  Legal guardian is: Jimmye Norman, [phone].

## 2020-07-28 NOTE — ED Notes (Signed)
Pt assisted to restroom by this RN. Pt walking and talking at this time. Much more alert then when he arrived to ER.

## 2020-07-28 NOTE — ED Notes (Signed)
Pt provided ginger ale and crackers, ok per EDP

## 2020-07-28 NOTE — Discharge Instructions (Addendum)
You have been seen in the emergency department for a likely seizure.  As we discussed as this is a first-time seizure no new medications were prescribed.  Please drink plenty water and obtain plenty of rest.  Please follow-up with neurology by calling the number provided to arrange a follow-up appointment.  Do not drive.  Do not swim or take a bath alone.  Return to the emergency department for any further seizure-like activity, or any other symptom personally concerning to yourself.

## 2020-07-28 NOTE — ED Provider Notes (Signed)
[facility] Emergency Department Provider Note  Time seen: 10:43 AM  I have reviewed the triage vital signs and the nursing notes.   HISTORY  Chief Complaint Seizures   HPI Darryl Hickman is a 24 y.o. male with a past medical history of ADHD, asthma, mental retardation, presents to the emergency department after a possible seizure.   According to report patient was at [employer] in a class when he had a focal seizure of the right hand lasting approximately 30 seconds.  Per report patient stated he was dizzy and was helped down to the ground.  Per report no history of seizures.  Currently patient is somnolent but will awaken to voice.  When asked questions will attempt to mumble a response, will follow basic commands.  Past Medical History:  Diagnosis Date  . ADHD   . Asthma   . Mental retardation, mild (I.Q. 50-70)     Patient Active Problem List   Diagnosis Date Noted  . Allergic rhinitis 07/18/2013  . Atopic dermatitis 07/18/2013  . Asthma, chronic 07/18/2013    Past Surgical History:  Procedure Laterality Date  . ORCHIECTOMY Right 06/22/2016   Procedure: INGUINAL ORCHIECTOMY;  Surgeon: Malen Gauze, MD;  Location: [facility];  Service: Urology;  Laterality: Right;    Prior to Admission medications   Medication Sig Start Date End Date Taking? Authorizing Provider  albuterol (PROVENTIL HFA;VENTOLIN HFA) 108 (90 BASE) MCG/ACT inhaler Inhale 2 puffs into the lungs every 4 (four) hours as needed for wheezing or shortness of breath. 06/09/14   Merlyn Albert, MD  albuterol (PROVENTIL) (2.5 MG/3ML) 0.083% nebulizer solution Take 2.5 mg by nebulization every 6 (six) hours as needed.      [provider]  amphetamine-dextroamphetamine (ADDERALL) 10 MG tablet One tablet at 12:30pm 09/29/16   Merlyn Albert, MD  amphetamine-dextroamphetamine (ADDERALL) 20 MG tablet one tablet at 8:00am 09/29/16   Merlyn Albert, MD  cetirizine  (ZYRTEC) 10 MG tablet TAKE ONE TABLET BY MOUTH EACH MORNING. 09/29/16   Merlyn Albert, MD  HYDROcodone-acetaminophen (NORCO) 5-325 MG tablet Take 1 tablet by mouth every 6 (six) hours as needed for moderate pain. 06/22/16   McKenzie, Mardene Celeste, MD  PARoxetine (PAXIL) 20 MG tablet Take 1 tablet (20 mg total) by mouth daily. 09/29/16   Merlyn Albert, MD  polyethylene glycol powder [facility]) powder One scoop daily 03/17/16   Merlyn Albert, MD  predniSONE (DELTASONE) 20 MG tablet Three qd for three d two qd for three d one qd for two d 12/10/14   Merlyn Albert, MD  triamcinolone cream (KENALOG) 0.1 % Apply 1 application topically 2 (two) times daily. 07/17/13   Merlyn Albert, MD  triamcinolone cream (KENALOG) 0.1 % Apply 1 application topically 2 (two) times daily. 12/10/14   Merlyn Albert, MD    Allergies  Allergen Reactions  . Sulfa Antibiotics   . Dimetapp Cold-Allergy [Brompheniramine-Phenylephrine] Itching    Family History  Problem Relation Age of Onset  . Anemia Mother   . Diabetes Mother     Social History Social History   Tobacco Use  . Smoking status: Never Smoker  . Smokeless tobacco: Never Used  Substance Use Topics  . Alcohol use: No  . Drug use: No    Review of Systems Unable to obtain adequate/accurate review of systems secondary to altered mental status  ____________________________________________   PHYSICAL EXAM:  VITAL SIGNS: ED Triage Vitals  Enc  Vitals Group     BP 07/28/20 1022 110/77     Pulse --      Resp 07/28/20 1022 14     Temp 07/28/20 1022 98.7 F (37.1 C)     Temp Source 07/28/20 1022 Oral     SpO2 07/28/20 1022 99 %     Weight 07/28/20 1024 170 lb (77.1 kg)     Height 07/28/20 1024 5\' 6"  (1.676 m)     Head Circumference --      Peak Flow --      Pain Score 07/28/20 1023 0     Pain Loc --      Pain Edu? --      Excl. in GC? --    Constitutional: Somnolent but awakens to voice.  Will mumble answers to  questions but is mostly unintelligible Eyes: Normal exam ENT      Head: Normocephalic and atraumatic.      Mouth/Throat: Mucous membranes are moist. Cardiovascular: Normal rate, regular rhythm.  Respiratory: Normal respiratory effort without tachypnea nor retractions. Breath sounds are clear  Gastrointestinal: Soft and nontender. No distention.   Musculoskeletal: Nontender with normal range of motion in all extremities.  Neurologic: Patient is mumbling responses to questions.  Will follow basic commands such as open eyes.  Unable to perform an adequate neurological exam. Skin:  Skin is warm, dry and intact.  Psychiatric: Somnolent  ____________________________________________    EKG  EKG viewed and interpreted by myself shows a normal sinus rhythm at 93 bpm with a narrow QRS, normal axis, normal intervals, nonspecific ST changes.  Most consistent with early repolarization, no reciprocal depressions.  ____________________________________________    RADIOLOGY  CT scan head is negative  ____________________________________________   INITIAL IMPRESSION / ASSESSMENT AND PLAN / ED COURSE  Pertinent labs & imaging results that were available during my care of the patient were reviewed by me and considered in my medical decision making (see chart for details).   Patient presents to the emergency department after a possible seizure today.  No reported history of seizures.  Patient is somnolent consistent with possible postictal state.  Differential would include seizure, syncope, metabolic or electrolyte abnormality.  We will check labs, CT scan of the head and continue to closely monitor.  Patient agreeable to plan of care.  Patient's lab work is overall reassuring.  CT scan head is negative, lab work is normal.  Patient appears very well currently.  Answering questions without difficulty.  Does have baseline mental retardation but is ambulating well answering questions well is asking if  he can go home.  We will contact the patient's group home and arrange for transfer back home.  Patient will need to follow-up with a neurologist.  Patient does not drive.  Darryl Hickman was evaluated in Emergency Department on 07/28/2020 for the symptoms described in the history of present illness. He was evaluated in the context of the global COVID-19 pandemic, which necessitated consideration that the patient might be at risk for infection with the SARS-CoV-2 virus that causes COVID-19. Institutional protocols and algorithms that pertain to the evaluation of patients at risk for COVID-19 are in a state of rapid change based on information released by regulatory bodies including the CDC and federal and state organizations. These policies and algorithms were followed during the patient's care in the ED.  ____________________________________________   FINAL CLINICAL IMPRESSION(S) / ED DIAGNOSES  Seizure   07/30/2020, MD 07/28/20 1417

## 2020-07-28 NOTE — ED Notes (Signed)
EDP at bedside  

## 2020-08-03 ENCOUNTER — Ambulatory Visit: Payer: Medicaid Other | Attending: Internal Medicine

## 2020-08-03 ENCOUNTER — Other Ambulatory Visit: Payer: Self-pay

## 2020-08-03 DIAGNOSIS — Z23 Encounter for immunization: Secondary | ICD-10-CM

## 2020-08-03 NOTE — Progress Notes (Signed)
   Covid-19 Vaccination Clinic  Name:  Darryl Hickman    MRN: [id number] DOB: 20-Oct-1995  08/03/2020  Darryl Hickman was observed post Covid-19 immunization for 15 minutes without incident. He was provided with Vaccine Information Sheet and instruction to access the V-Safe system.   Darryl Hickman was instructed to call 911 with any severe reactions post vaccine: Marland Kitchen Difficulty breathing  . Swelling of face and throat  . A fast heartbeat  . A bad rash all over body  . Dizziness and weakness   Immunizations Administered    Name Date Dose VIS Date Route   Pfizer COVID-19 Vaccine 08/03/2020  1:25 PM 0.3 mL 06/03/2020 Intramuscular   Manufacturer: [employer], [employer]   Lot: [id number]   NDC: [address]-6

## 2021-05-15 IMAGING — CT CT HEAD W/O CM
3 series · 16 of 47 positions shown, 19 images · non-contrast
Comparison: None.

CLINICAL DATA: Seizure

EXAM:
CT HEAD WITHOUT CONTRAST
TECHNIQUE: Contiguous axial images were obtained from the base of the skull
through the vertex without intravenous contrast.

[Series 3: head wo · axial · 0.42mm/px · z∈[-201,-76]mm · 10 of 31 slices shown, 13 images]
[im 3/31  brain]
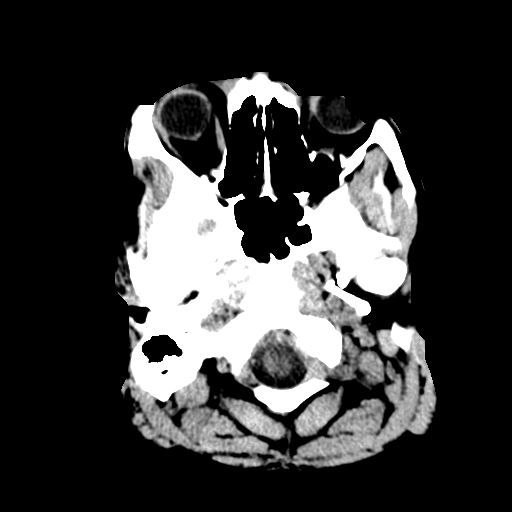
[im 3/31  bone]
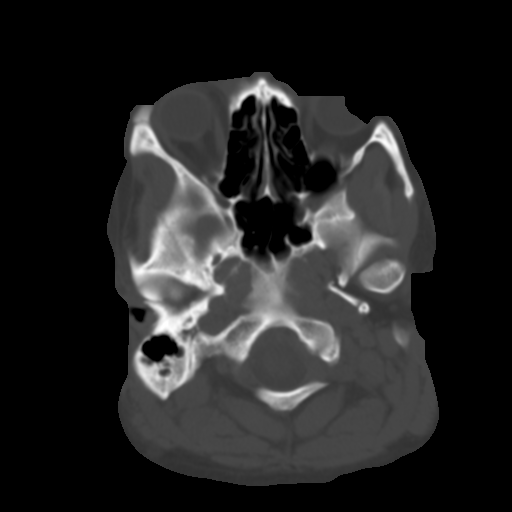
[im 6/31  brain]
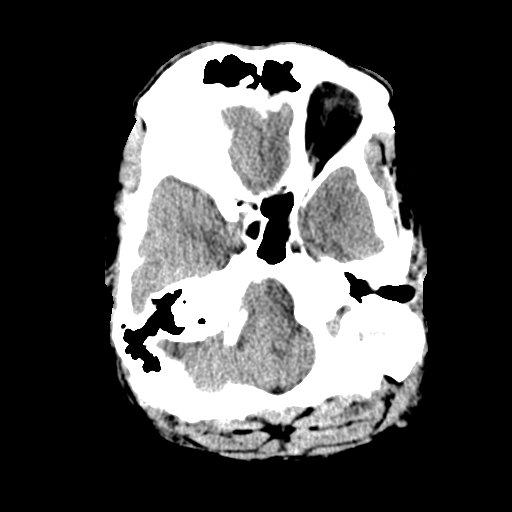
[im 9/31  brain]
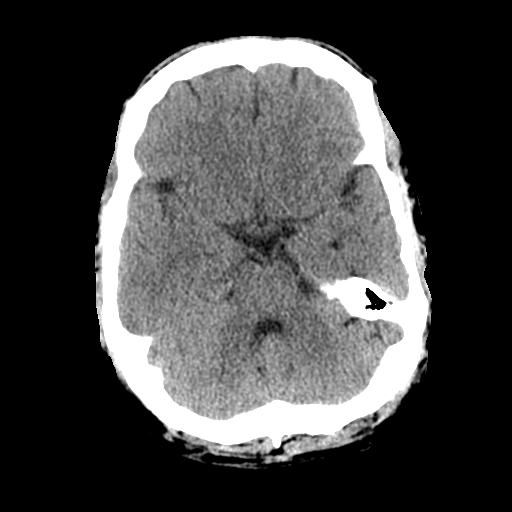
[im 11/31  brain]
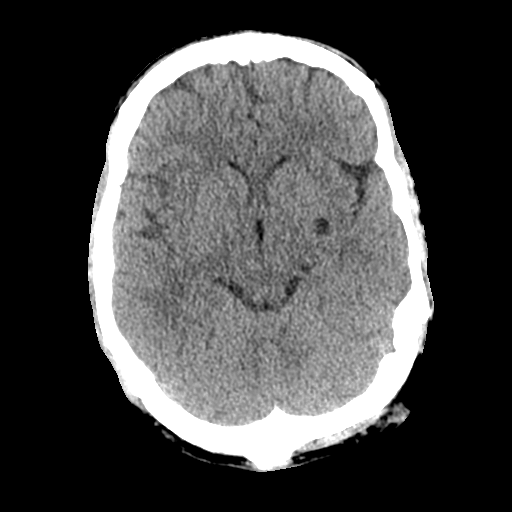
[im 14/31  brain]
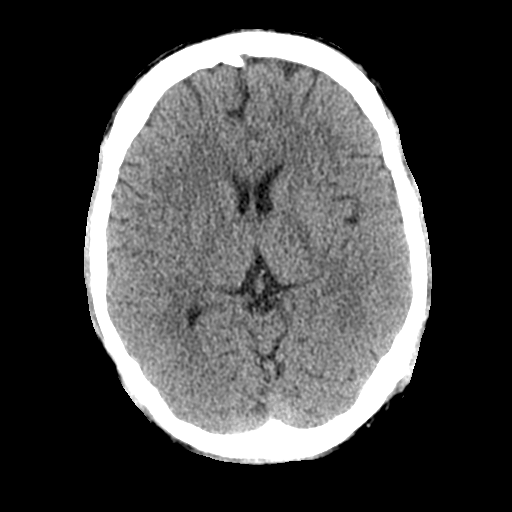
[im 14/31  bone]
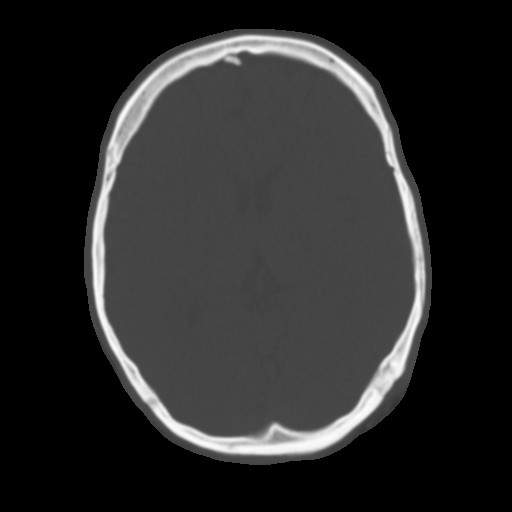
[im 17/31  brain]
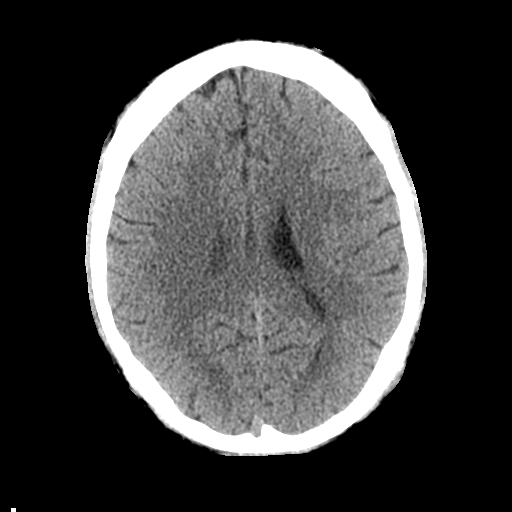
[im 20/31  brain]
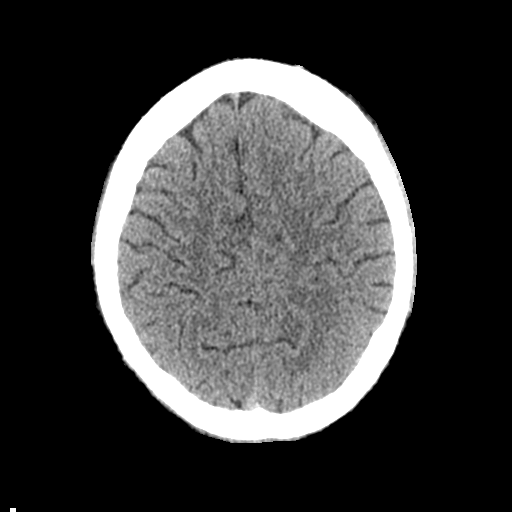
[im 23/31  brain]
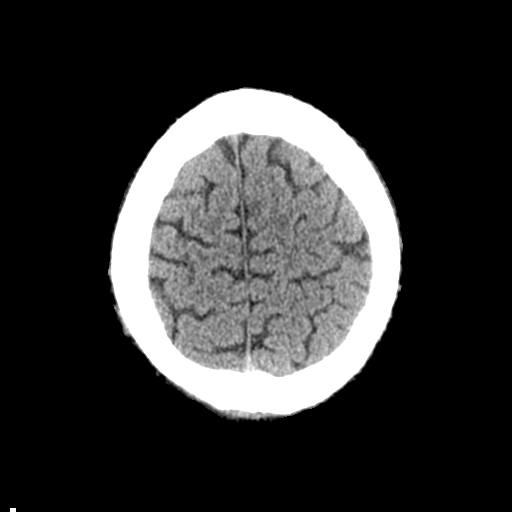
[im 25/31  brain]
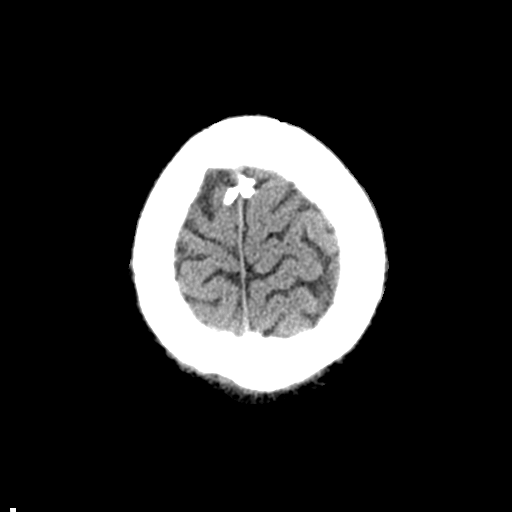
[im 25/31  bone]
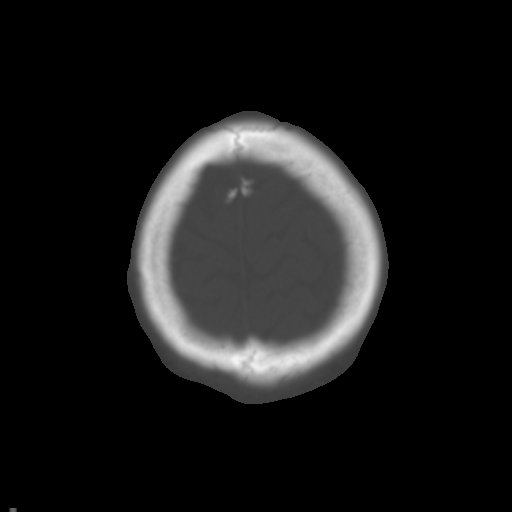
[im 28/31  brain]
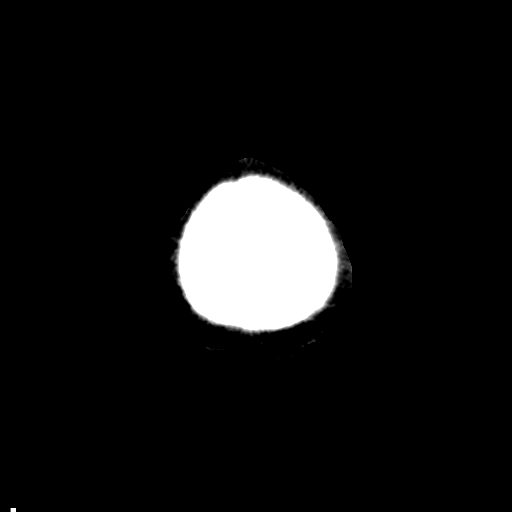

[Series 4: coronal soft tissue · coronal · 0.33mm/px · 3 of 61 slices shown]
[im 21/61  brain]
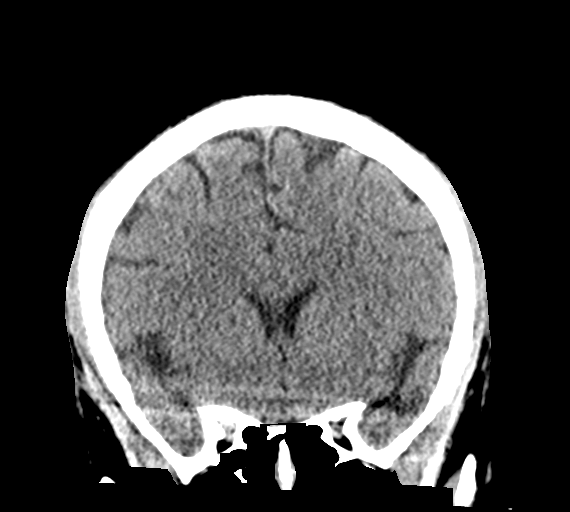
[im 27/61  brain]
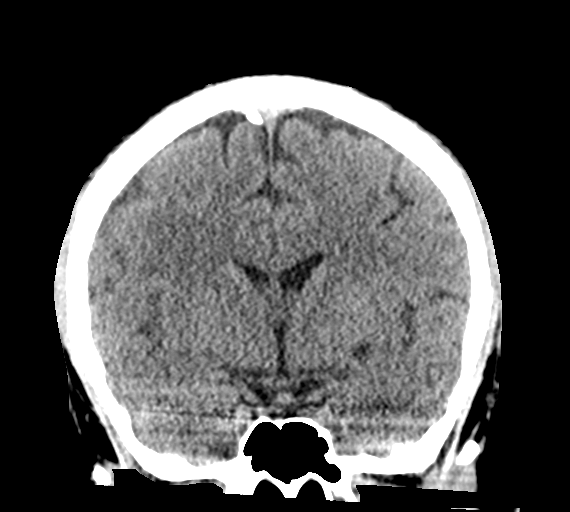
[im 34/61  brain]
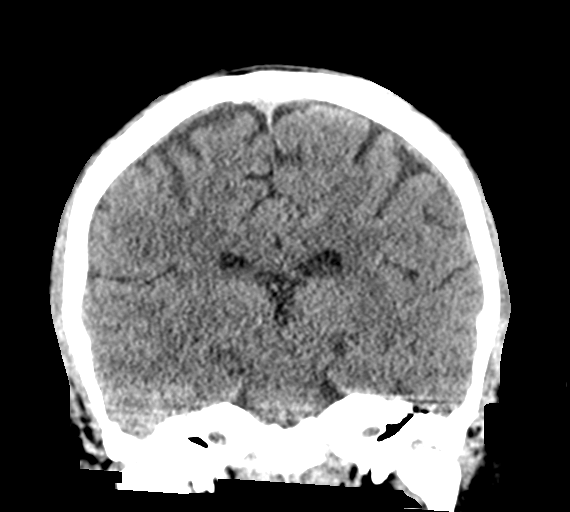

[Series 5: sagittal soft tissue · sagittal · 0.33mm/px · 3 of 54 slices shown]
[im 18/54  brain]
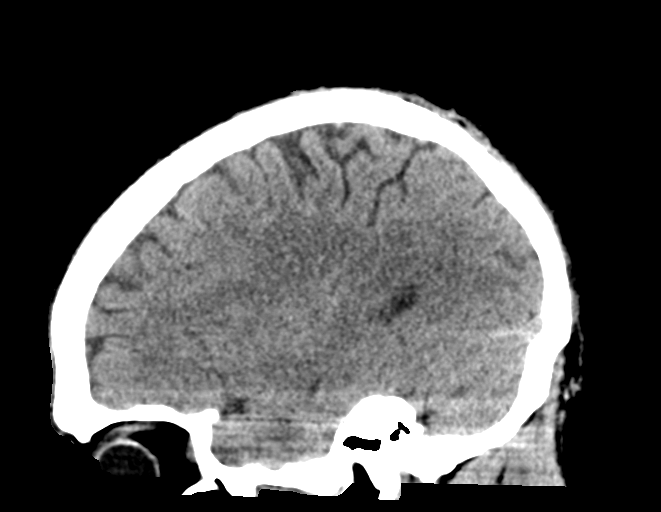
[im 27/54  brain]
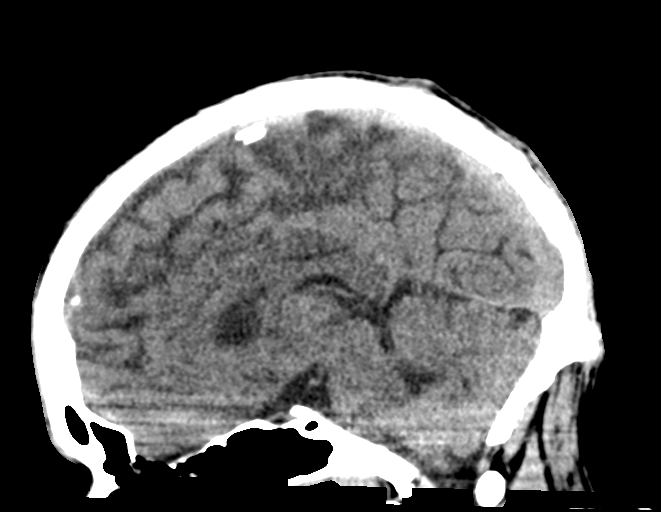
[im 36/54  brain]
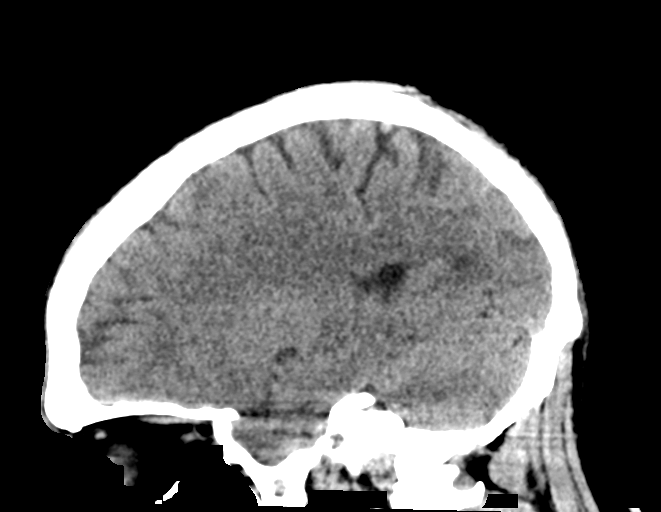

[16 of 47 positions shown; findings below may reference images not displayed]

FINDINGS: Brain: No evidence of acute infarction, hemorrhage, hydrocephalus,
extra-axial collection or mass lesion/mass effect. Focal rounded
hypodensity in the inferior left basal ganglia, most likely a
dilated perivascular space given characteristic location.

Vascular: No hyperdense vessel identified.

Skull: No acute fracture.

Sinuses/Orbits: Visualized sinuses are clear.  Unremarkable orbits.

Other: No mastoid effusions.
IMPRESSION: No evidence of acute intracranial abnormality.

## 2024-01-24 ENCOUNTER — Other Ambulatory Visit: Payer: Self-pay

## 2024-01-24 ENCOUNTER — Emergency Department
Admission: EM | Admit: 2024-01-24 | Discharge: 2024-01-27 | Disposition: A | Discharge status: Home / Self Care | Payer: MEDICAID

## 2024-01-24 DIAGNOSIS — R4689 Other symptoms and signs involving appearance and behavior: Secondary | ICD-10-CM

## 2024-01-24 DIAGNOSIS — R45851 Suicidal ideations: Secondary | ICD-10-CM

## 2024-01-24 DIAGNOSIS — R45850 Homicidal ideations: Secondary | ICD-10-CM | POA: Diagnosis not present

## 2024-01-24 DIAGNOSIS — J45909 Unspecified asthma, uncomplicated: Secondary | ICD-10-CM | POA: Diagnosis not present

## 2024-01-24 DIAGNOSIS — F419 Anxiety disorder, unspecified: Secondary | ICD-10-CM | POA: Insufficient documentation

## 2024-01-24 LAB — COMPREHENSIVE METABOLIC PANEL WITH GFR
ALT: 25 U/L (ref 0–44)
AST: 26 U/L (ref 15–41)
Albumin: 4.3 g/dL (ref 3.5–5.0)
Alkaline Phosphatase: 44 U/L (ref 38–126)
Anion gap: 11 (ref 5–15)
BUN: 12 mg/dL (ref 6–20)
CO2: 24 mmol/L (ref 22–32)
Calcium: 9.5 mg/dL (ref 8.9–10.3)
Chloride: 105 mmol/L (ref 98–111)
Creatinine, Ser: 1.1 mg/dL (ref 0.61–1.24)
GFR, Estimated: >60 mL/min (ref >60)
Glucose, Bld: 96 mg/dL (ref 70–99)
Potassium: 3.7 mmol/L (ref 3.5–5.1)
Sodium: 140 mmol/L (ref 135–145)
Total Bilirubin: 0.7 mg/dL (ref 0.0–1.2)
Total Protein: 7.4 g/dL (ref 6.5–8.1)

## 2024-01-24 LAB — CBC
HCT: 40.5 % (ref 39.0–52.0)
Hemoglobin: 13.4 g/dL (ref 13.0–17.0)
MCH: 29.8 pg (ref 26.0–34.0)
MCHC: 33.1 g/dL (ref 30.0–36.0)
MCV: 90 fL (ref 80.0–100.0)
Platelets: 113 10*3/uL — ABNORMAL LOW (ref 150–400)
RBC: 4.5 MIL/uL (ref 4.22–5.81)
RDW: 11.9 % (ref 11.5–15.5)
WBC: 7.1 10*3/uL (ref 4.0–10.5)
nRBC: 0 % (ref 0.0–0.2)

## 2024-01-24 LAB — URINE DRUG SCREEN, QUALITATIVE (ARMC ONLY)
Amphetamines, Ur Screen: NOT DETECTED
Barbiturates, Ur Screen: NOT DETECTED
Benzodiazepine, Ur Scrn: NOT DETECTED
Cannabinoid 50 Ng, Ur ~~LOC~~: NOT DETECTED
Cocaine Metabolite,Ur ~~LOC~~: NOT DETECTED
MDMA (Ecstasy)Ur Screen: NOT DETECTED
Methadone Scn, Ur: NOT DETECTED
Opiate, Ur Screen: NOT DETECTED
Phencyclidine (PCP) Ur S: NOT DETECTED
Tricyclic, Ur Screen: POSITIVE — AB

## 2024-01-24 LAB — ETHANOL: Alcohol, Ethyl (B): <15 mg/dL (ref <15)

## 2024-01-24 NOTE — BH Assessment (Signed)
 This Clinical research associate contacted [facility] via phone to request an assessment for this patient, request has been made, assessment is currently pending.

## 2024-01-24 NOTE — ED Notes (Signed)
 RN taking over care of pt at this time. Pt reporting to ED under IVC after making threats to harm people. Per report, pt did not actually mean it, he was just hurting because he misses his mom who passed away. Pt has been calm and cooperative. Pt ABCs intact. RR even and unlabored. Pt in NAD.  Past Medical History:  Diagnosis Date   ADHD    Asthma    Mental retardation, mild (I.Q. 50-70)

## 2024-01-24 NOTE — ED Notes (Signed)
 Snack given to pt at this time. Pt very polite at this time

## 2024-01-24 NOTE — ED Notes (Signed)
 1 pair slides 1 pair black socks

## 2024-01-24 NOTE — ED Notes (Signed)
 PT R foot/ankle wrapped with ace wrap per request. No swelling to area. Pt just stated that he normally wraps it every night for comfort.

## 2024-01-24 NOTE — ED Notes (Signed)
 Pt brought in by bpd.  Pt is IVC.  Pt reportedly got upset with staff at his group home today.  Pt stated he was mad and said he was SI/HI.  Pt denies drugs or etoh use.   Pt states he wants to go home now.  Pt has intellectual disability.  Pt soft spoken .Aaron Aas  Pt in hallway bed.

## 2024-01-24 NOTE — ED Provider Notes (Signed)
 [facility] Provider Note    Event Date/Time   First MD Initiated Contact with Patient 01/24/24 1816     (approximate)   History   Suicidal  Pt to ED via [employer] under IVC. Paper states pt has made SI/HI to mobile crisis. Paper states pt has made threats to harm staff. Pt denies SI/HI on arrival.   Pt resides at [address] [place]      HPI Darryl Hickman is a 28 y.o. male MH mental retardation, asthma, ADHD presents for evaluation under PD IVC after making threats to harm staff and himself -Per IVC paperwork filed by police, patient stated to mobile crisis center that he is wanting to harm himself as well as the staff at the group home where he resides. -On my evaluation, patient is calm and cooperative, repeatedly tells me that he wants to go home.  Does endorse that he made a threat to staff earlier but says he felt they had acted inappropriately to a handicapped person.  Denies any SI, HI, hallucinations.     Physical Exam   Triage Vital Signs: ED Triage Vitals [01/24/24 1740]  Encounter Vitals Group     BP 131/89     Systolic BP Percentile      Diastolic BP Percentile      Pulse Rate (!) 108     Resp 20     Temp 98.5 F (36.9 C)     Temp Source Oral     SpO2 100 %     Weight      Height      Head Circumference      Peak Flow      Pain Score 0     Pain Loc      Pain Education      Exclude from Growth Chart     Most recent vital signs: Vitals:   01/24/24 1740 01/24/24 2012  BP: 131/89 114/70  Pulse: (!) 108 89  Resp: 20 18  Temp: 98.5 F (36.9 C) (!) 97.5 F (36.4 C)  SpO2: 100% 99%     General: Awake, no distress.  CV:  Good peripheral perfusion. RRR, RP 2+ Resp:  Normal effort. CTAB Abd:  No distention. Nontender to deep palpation throughout Psych:  Calm, cooperative, denies SI, HI, hallucinations   ED Results / Procedures / Treatments   Labs (all labs ordered are listed, but only abnormal results are  displayed) Labs Reviewed  CBC - Abnormal; Notable for the following components:      Result Value   Platelets 113 (*)    All other components within normal limits  URINE DRUG SCREEN, QUALITATIVE ([facility] ONLY) - Abnormal; Notable for the following components:   T[employer], Ur Screen POSITIVE (*)    All other components within normal limits  COMPREHENSIVE METABOLIC PANEL WITH GFR  ETHANOL     EKG  N/a   RADIOLOGY N/a    PROCEDURES:  Critical Care performed: No  Procedures   MEDICATIONS ORDERED IN ED: Medications - No data to display   IMPRESSION / MDM / ASSESSMENT AND PLAN / ED COURSE  I reviewed the triage vital signs and the nursing notes.                              DDX/MDM/AP: Differential diagnosis includes, but is not limited to, behavioral issue, consider underlying psychiatric disorder, doubt concomitant substance use, do not  suspect underlying organic etiology of behavior today.  Plan: - Screening psychiatric labs - Will maintain IVC that was placed prior to arrival for now as I do not have explicit details of what occurred, defer to psychiatry on whether maintaining an IVC is appropriate  Patient's presentation is most consistent with acute presentation with potential threat to life or bodily function.    ED course below.  Medically cleared.  Psychiatry consulted.  Disposition per psychiatry.  Clinical Course as of 01/24/24 2339  Wed Jan 24, 2024  1919 Labs reviewed, unremarkable [MM]    Clinical Course User Index [MM] Collis Deaner, MD     FINAL CLINICAL IMPRESSION(S) / ED DIAGNOSES   Final diagnoses:  Behavior problem     Rx / DC Orders   ED Discharge Orders     None        Note:  This document was prepared using Dragon voice recognition software and may include unintentional dictation errors.   Collis Deaner, MD 01/24/24 [phone]

## 2024-01-24 NOTE — ED Triage Notes (Signed)
 Pt to ED via [employer] under IVC. Paper states pt has made SI/HI to mobile crisis. Paper states pt has made threats to harm staff. Pt denies SI/HI on arrival.   Pt resides at [address] [place]

## 2024-01-24 NOTE — BH Assessment (Signed)
 Comprehensive Clinical Assessment (CCA) Note  01/24/2024 Darryl Hickman [id number]  Chief Complaint: Patient is a 28 year old male presenting to [facility] ED under IVC. Per triage note Pt to ED via [facility] under IVC. Paper states pt has made SI/HI to mobile crisis. Paper states pt has made threats to harm staff. Pt denies SI/HI on arrival. During assessment patient appears alert and oriented x4, calm and cooperative, speech is soft with articulation error as the patient is IDD. Patient currently resides in a group home and he reports you know how you take your anger out on other people, that's what I did, I got mad and they said they were going to write me up. Patient at times is very focused on returning back to the group home. Patient reports that he has been living at this group home for 7-8 years, he reports feeling upset because he was thinking about his mother who passed away but is unable to recall when she passed away. Patient reports that he takes his medications as prescribes and likes the group home that he lives in. Patient denies SI/HI/AH/VH. Chief Complaint  Patient presents with   Suicidal   Visit Diagnosis:     CCA Screening, Triage and Referral (STR)  Patient Reported Information How did you hear about us ? Other (Comment)  Referral name: No data recorded Referral phone number: No data recorded  Whom do you see for routine medical problems? No data recorded Practice/Facility Name: No data recorded Practice/Facility Phone Number: No data recorded Name of Contact: No data recorded Contact Number: No data recorded Contact Fax Number: No data recorded Prescriber Name: No data recorded Prescriber Address (if known): No data recorded  What Is the Reason for Your Visit/Call Today? Pt to ED via [employer] under IVC. Paper states pt has made SI/HI to mobile crisis. Paper states pt has made threats to harm staff. Pt denies SI/HI on arrival.  How Long Has This Been Causing You Problems? >  than 6 months  What Do You Feel Would Help You the Most Today? No data recorded  Have You Recently Been in Any Inpatient Treatment (Hospital/Detox/Crisis Center/28-Day Program)? No data recorded Name/Location of Program/Hospital:No data recorded How Long Were You There? No data recorded When Were You Discharged? No data recorded  Have You Ever Received Services From [facility] Before? No data recorded Who Do You See at [facility]? No data recorded  Have You Recently Had Any Thoughts About Hurting Yourself? Yes  Are You Planning to Commit Suicide/Harm Yourself At This time? No   Have you Recently Had Thoughts About Hurting Someone Marigene Shoulder? No  Explanation: No data recorded  Have You Used Any Alcohol or Drugs in the Past 24 Hours? No  How Long Ago Did You Use Drugs or Alcohol? No data recorded What Did You Use and How Much? No data recorded  Do You Currently Have a Therapist/Psychiatrist? No data recorded Name of Therapist/Psychiatrist: No data recorded  Have You Been Recently Discharged From Any Office Practice or Programs? No  Explanation of Discharge From Practice/Program: No data recorded    CCA Screening Triage Referral Assessment Type of Contact: Face-to-Face  Is this Initial or Reassessment? No data recorded Date Telepsych consult ordered in [facility]:  No data recorded Time Telepsych consult ordered in [facility]:  No data recorded  Patient Reported Information Reviewed? No data recorded Patient Left Without Being Seen? No data recorded Reason for Not Completing Assessment: No data recorded  Collateral Involvement: No data  recorded  Does Patient Have a Automotive engineer Guardian? No data recorded Name and Contact of Legal Guardian: No data recorded If Minor and Not Living with Parent(s), Who has Custody? No data recorded Is CPS involved or ever been involved? Never  Is APS involved or ever been involved? Never   Patient Determined To Be At Risk for Harm To Self or  Others Based on Review of Patient Reported Information or Presenting Complaint? No  Method: No data recorded Availability of Means: No data recorded Intent: No data recorded Notification Required: No data recorded Additional Information for Danger to Others Potential: No data recorded Additional Comments for Danger to Others Potential: No data recorded Are There Guns or Other Weapons in Your Home? No  Types of Guns/Weapons: No data recorded Are These Weapons Safely Secured?                            No data recorded Who Could Verify You Are Able To Have These Secured: No data recorded Do You Have any Outstanding Charges, Pending Court Dates, Parole/Probation? No data recorded Contacted To Inform of Risk of Harm To Self or Others: No data recorded  Location of Assessment: [facility] ED   Does Patient Present under Involuntary Commitment? No  IVC Papers Initial File Date: No data recorded  [place] of Residence: [place]   Patient Currently Receiving the Following Services: Group Home; Medication Management   Determination of Need: Emergent (2 hours)   Options For Referral: No data recorded    CCA Biopsychosocial Intake/Chief Complaint:  No data recorded Current Symptoms/Problems: No data recorded  Patient Reported Schizophrenia/Schizoaffective Diagnosis in Past: No   Strengths: Patient is able to communicate his needs to the best of his ability  Preferences: No data recorded Abilities: No data recorded  Type of Services Patient Feels are Needed: No data recorded  Initial Clinical Notes/Concerns: No data recorded  Mental Health Symptoms Depression:  Fatigue   Duration of Depressive symptoms: Greater than two weeks   Mania:  None   Anxiety:   None   Psychosis:  None   Duration of Psychotic symptoms: No data recorded  Trauma:  None   Obsessions:  None   Compulsions:  Poor Insight; Repeated behaviors/mental acts   Inattention:  None    Hyperactivity/Impulsivity:  None   Oppositional/Defiant Behaviors:  Defies rules; Resentful   Emotional Irregularity:  Recurrent suicidal behaviors/gestures/threats   Other Mood/Personality Symptoms:  No data recorded   Mental Status Exam Appearance and self-care  Stature:  Average   Weight:  Average weight   Clothing:  Casual   Grooming:  Normal   Cosmetic use:  None   Posture/gait:  Normal   Motor activity:  Not Remarkable   Sensorium  Attention:  Normal   Concentration:  Normal   Orientation:  X5   Recall/memory:  Normal   Affect and Mood  Affect:  Appropriate   Mood:  Other (Comment)   Relating  Eye contact:  Normal   Facial expression:  Responsive   Attitude toward examiner:  Cooperative   Thought and Language  Speech flow: Articulation error; Soft   Thought content:  Appropriate to Mood and Circumstances   Preoccupation:  None   Hallucinations:  None   Organization:  No data recorded  [employer] of Knowledge:  Impoverished by (Comment) (Patient is IDD)   Intelligence:  Needs investigation   Abstraction:  Functional   Judgement:  Poor   Reality Testing:  Variable   Insight:  Lacking; Poor   Decision Making:  Impulsive   Social Functioning  Social Maturity:  Impulsive   Social Judgement:  Naive   Stress  Stressors:  Other (Comment)   Coping Ability:  Normal   Skill Deficits:  Intellect/education; Communication; Decision making   Supports:  Friends/Service system     Religion: Religion/Spirituality Are You A Religious Person?: No  Leisure/Recreation: Leisure / Recreation Do You Have Hobbies?: No  Exercise/Diet: Exercise/Diet Do You Exercise?: No Have You Gained or Lost A Significant Amount of Weight in the Past Six Months?: No Do You Follow a Special Diet?: No Do You Have Any Trouble Sleeping?: No   CCA Employment/Education Employment/Work Situation: Employment / Work Field seismologist: On disability Why is Patient on Disability: IDD How Long has Patient Been on Disability: Unknown Has Patient ever Been in the [employer]?: No  Education: Education Is Patient Currently Attending School?: No Did You Have An Individualized Education Program (IIEP): No Did You Have Any Difficulty At [employer]?: No Patient's Education Has Been Impacted by Current Illness: No   CCA Family/Childhood History Family and Relationship History: Family history Marital status: Single Does patient have children?: No  Childhood History:  Childhood History By whom was/is the patient raised?: Mother Did patient suffer any verbal/emotional/physical/sexual abuse as a child?: No Did patient suffer from severe childhood neglect?: No Has patient ever been sexually abused/assaulted/raped as an adolescent or adult?: No Was the patient ever a victim of a crime or a disaster?: No Witnessed domestic violence?: No Has patient been affected by domestic violence as an adult?: No  Child/Adolescent Assessment:     CCA Substance Use Alcohol/Drug Use: Alcohol / Drug Use Pain Medications: see mar Prescriptions: see mar Over the Counter: see mar History of alcohol / drug use?: No history of alcohol / drug abuse                         ASAM's:  Six Dimensions of Multidimensional Assessment  Dimension 1:  Acute Intoxication and/or Withdrawal Potential:      Dimension 2:  Biomedical Conditions and Complications:      Dimension 3:  Emotional, Behavioral, or Cognitive Conditions and Complications:     Dimension 4:  Readiness to Change:     Dimension 5:  Relapse, Continued use, or Continued Problem Potential:     Dimension 6:  Recovery/Living Environment:     ASAM Severity Score:    ASAM Recommended Level of Treatment:     Substance use Disorder (SUD)    Recommendations for Services/Supports/Treatments:    DSM5 Diagnoses: Patient Active Problem List   Diagnosis Date Noted    Allergic rhinitis 07/18/2013   Atopic dermatitis 07/18/2013   Asthma, chronic 07/18/2013    Patient Centered Plan: Patient is on the following Treatment Plan(s):  Impulse Control   Referrals to Alternative Service(s): Referred to Alternative Service(s):   Place:   Date:   Time:    Referred to Alternative Service(s):   Place:   Date:   Time:    Referred to Alternative Service(s):   Place:   Date:   Time:    Referred to Alternative Service(s):   Place:   Date:   Time:      @BHCOLLABOFCARE @  [employer], LCAS-A

## 2024-01-25 DIAGNOSIS — R4689 Other symptoms and signs involving appearance and behavior: Secondary | ICD-10-CM

## 2024-01-25 DIAGNOSIS — R45851 Suicidal ideations: Secondary | ICD-10-CM

## 2024-01-25 DIAGNOSIS — R45850 Homicidal ideations: Secondary | ICD-10-CM

## 2024-01-25 NOTE — ED Notes (Signed)
 Darryl Hickman [phone] Guardian ( [place])

## 2024-01-25 NOTE — BH Assessment (Signed)
 [facility] communicated via chat that they were unable to complete psyc assessment before shift change, defer to dayshift NP

## 2024-01-25 NOTE — ED Provider Notes (Signed)
 Emergency Medicine Observation Re-evaluation Note  Darryl Hickman is a 28 y.o. male, seen on rounds today.  Pt initially presented to the ED for complaints of Suicidal Currently, the patient is resting, voices no medical complaints.  Physical Exam  BP 125/70 (BP Location: Right Arm)   Pulse 89   Temp (!) 97.5 F (36.4 C) (Oral)   Resp 20   SpO2 100%  Physical Exam General: Resting in NAD Cardiac: No cyanosis Lungs: Equal rise and fall Psych: Not agitated  ED Course / MDM  EKG:   I have reviewed the labs performed to date as well as medications administered while in observation.  Recent changes in the last 24 hours include no events overnight.  Plan  Current plan is for psychiatric disposition.    Cataleia Gade J, MD 01/25/24 [phone]

## 2024-01-25 NOTE — ED Notes (Signed)
IVC/Pending Consult
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
## 2024-01-25 NOTE — ED Notes (Signed)
 Pt asked for phone to call guardian. Pt provided with phone and coloring materials. Pt is calm and in a good mood. Pt participated in activity.

## 2024-01-25 NOTE — ED Notes (Signed)
IVC  MOVED TO  B[facility]  PENDING  CONSULT 

## 2024-01-25 NOTE — ED Notes (Signed)
 Hospital meal provided, pt tolerated w/o complaints.  Waste discarded appropriately.

## 2024-01-25 NOTE — ED Notes (Signed)
 Pt provided with shower supplies including new paper scrubs, underwear, socks, and hygiene items. This tech also changed pt bed linens. Pt is now out of shower.

## 2024-01-25 NOTE — ED Notes (Signed)
Warm blanket provided to pt.

## 2024-01-25 NOTE — ED Notes (Signed)
 Pt given lunch at this time.

## 2024-01-25 NOTE — ED Notes (Signed)
Pt ambulatory to restroom. Gait steady

## 2024-01-25 NOTE — Consult Note (Signed)
 Attempted to visit with patient. Patient transferring to [facility]. Will attempt to complete visit once patient is available at new location.

## 2024-01-25 NOTE — ED Notes (Signed)
Snack was provided.

## 2024-01-25 NOTE — ED Notes (Signed)
 Calvin tts talking with pt in his room

## 2024-01-25 NOTE — BH Assessment (Signed)
 This Clinical research associate attempted to contact group home owner, Denman Fischer Chrisp [phone] for collateral; however, there was no answer/ability to leave voicemail. TTS to follow up.

## 2024-01-25 NOTE — ED Notes (Signed)
Pt provided with breakfast tray and beverage

## 2024-01-26 NOTE — ED Notes (Signed)
 IVC/Pending Placement

## 2024-01-26 NOTE — Consult Note (Cosign Needed)
 Patient under IVC for SI/HI. He has a hx of IDD with behavioral disturbances per group home owner. No patient behavioral concerns reported this shift. Collateral Contact with group home owner, Ms. Pollyann Brinks completed. Group home owner states that she assumed that [facility] phone number was a telemarketer, as to why she has not answered the calls. Per group home owner, patient is an established patient at [facility] for outpatient psychiatry, with his last appointment on Friday. Patient has been living at his current group home for at least the past 8 years and has experienced behavioral disturbances in the past that were tolerable by staff. Per group home owner, patient behaviors have worsened over the past month when his mother died. Group home owner requested to speak with patient via phone while he is in observation to determine if he is calm enough to return. Attempted to obtain patient's current medication regimen and compare with [facility]'s medication list to restart medications. Group home owner agreed to access the [facility] soon and provide to [facility] staff.   Group home owner also reluctant to allow patient to return, as she is arranging for additional resources to manage his symptoms. Group home owner reports that she plans to increase therapy visits at the [facility] day program where Norm attends, to weekly therapy sessions, with Dr. Ricarda Challenger  at a [facility] [place]. Assigned RN updated and provided with Ms. Dearl Fabry contact information [phone], as well.

## 2024-01-26 NOTE — ED Notes (Signed)
 Breakfast tray with apple juice provided.

## 2024-01-26 NOTE — ED Provider Notes (Signed)
 Emergency Medicine Observation Re-evaluation Note  Darryl Hickman is a 28 y.o. male, seen on rounds today.  Pt initially presented to the ED for complaints of Suicidal  Currently, the patient is no acute distress. Pt asleep no issues per bhu nurse   Physical Exam  Blood pressure 125/89, pulse 94, temperature 97.9 F (36.6 C), temperature source Oral, resp. rate 16, SpO2 100%.  Physical Exam General: No apparent distress Pulm: Normal W[facility] Psych: resting     ED Course / MDM   Clinical Course as of 01/26/24 0740  Wed Jan 24, 2024  1919 Labs reviewed, unremarkable [MM]    Clinical Course User Index [MM] Collis Deaner, MD    I have reviewed the labs performed to date as well as medications administered while in observation.  Recent changes in the last 24 hours include none   Plan   Current plan is to continue to wait for psych plan/placement if felt warranted  Patient is under full IVC at this time.   [name], MD 01/26/24 [id number]

## 2024-01-26 NOTE — ED Notes (Signed)
 Patient given dinner tray.

## 2024-01-26 NOTE — ED Notes (Addendum)
 Per request, this RN and patient attempted to contact Ms. Darryl Hickman, with no answer at this time.
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
## 2024-01-27 DIAGNOSIS — R4689 Other symptoms and signs involving appearance and behavior: Secondary | ICD-10-CM | POA: Diagnosis not present

## 2024-01-27 DIAGNOSIS — R45851 Suicidal ideations: Secondary | ICD-10-CM | POA: Diagnosis not present
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
## 2024-01-27 NOTE — ED Notes (Signed)
 Pt continues to present at nurses station requesting I want to go home [facility] staff explained again that we will get him there soon

## 2024-01-27 NOTE — ED Provider Notes (Signed)
 Emergency Medicine Observation Re-evaluation Note  Darryl Hickman is a 28 y.o. male, seen on rounds today.  Pt initially presented to the ED for complaints of Suicidal Currently, the patient is resting in no distress.  Physical Exam  BP 127/83   Pulse (!) 123   Temp 98.6 F (37 C)   Resp 20   SpO2 99%  Physical Exam General: No acute distress  ED Course / MDM  EKG:   I have reviewed the labs performed to date as well as medications administered while in observation.  Recent changes in the last 24 hours include no acute events.  Plan  Current plan is for disposition per psychiatry team.    Collis Deaner, MD 01/27/24 [phone]

## 2024-01-27 NOTE — ED Notes (Signed)
 [employer] [facility] notified, request for transport. [facility] stated they will have an officer in route for transport.

## 2024-01-27 NOTE — ED Notes (Signed)
 spoke to guardian rep from [facility] [place] Juluis Ok LCSW @ [phone] - Verbal permission given to discharge as well as retun to facility via safe transport. We will need the EDP to rescind the IVC, as well as to: [facility] [address] [place] [address]

## 2024-01-27 NOTE — ED Provider Notes (Signed)
-----------------------------------------   3:25 PM on 01/27/2024 ----------------------------------------- Patient has been seen and evaluated by psychiatry.  They believe the patient safe for discharge home from psychiatric standpoint.  Patient's medical workup has been largely nonrevealing with reassuring lab work.  We will resend the patient's ABC as recommended by psychiatry and arrange for discharge.   Ruth Cove, MD 01/27/24 1526

## 2024-01-27 NOTE — Discharge Instructions (Addendum)
You have been seen in the emergency department for a  psychiatric concern. You have been evaluated both medically as well as psychiatrically. Please follow-up with your outpatient resources provided. Return to the emergency department for any worsening symptoms, or any thoughts of hurting yourself or anyone else so that we may attempt to help you. 

## 2024-01-27 NOTE — ED Notes (Signed)
 Pt came from room to day area, shaking, tearful stated I want to go home Pt is very upset, staff attempted to explain process; however, due to pt's Dx Hx of IDD he is having difficulty understanding.  Pt also voiced that he was scared of here, I just want to go home, I promise I'll never call those people again. Staff contacted psych [facility] as pt was to be re-evaluated

## 2024-01-27 NOTE — ED Notes (Signed)
 Pt is A/Ox 3, Darryl Hickman declines any SI/HI stated that he is not having any A/V hallucinations.  Discharge instructions reviewed with [facility] owner, Ms. Crisp's contact information [phone], who verbalized understanding.  All Belongings accounted for and returned to PT.  Pt left ambulatory via [employer] for return trip home

## 2024-03-11 ENCOUNTER — Encounter: Payer: Self-pay | Admitting: Nurse Practitioner

## 2024-03-22 ENCOUNTER — Other Ambulatory Visit: Payer: Self-pay | Admitting: Nurse Practitioner

## 2024-03-22 ENCOUNTER — Ambulatory Visit
Admission: RE | Admit: 2024-03-22 | Discharge: 2024-03-22 | Disposition: A | Discharge status: Home / Self Care | Payer: MEDICAID | Attending: Nurse Practitioner | Admitting: Nurse Practitioner

## 2024-03-22 ENCOUNTER — Ambulatory Visit
Admission: RE | Admit: 2024-03-22 | Discharge: 2024-03-22 | Disposition: A | Discharge status: Home / Self Care | Payer: MEDICAID | Source: Ambulatory Visit | Attending: Nurse Practitioner | Admitting: Nurse Practitioner

## 2024-03-22 DIAGNOSIS — R52 Pain, unspecified: Secondary | ICD-10-CM | POA: Diagnosis present

## 2024-07-31 ENCOUNTER — Other Ambulatory Visit: Payer: Self-pay

## 2024-07-31 ENCOUNTER — Emergency Department
Admission: EM | Admit: 2024-07-31 | Discharge: 2024-08-02 | Disposition: A | Payer: MEDICAID | Attending: Emergency Medicine | Admitting: Emergency Medicine

## 2024-07-31 DIAGNOSIS — F71 Moderate intellectual disabilities: Secondary | ICD-10-CM

## 2024-07-31 DIAGNOSIS — F419 Anxiety disorder, unspecified: Secondary | ICD-10-CM

## 2024-07-31 DIAGNOSIS — F909 Attention-deficit hyperactivity disorder, unspecified type: Secondary | ICD-10-CM | POA: Diagnosis present

## 2024-07-31 DIAGNOSIS — F901 Attention-deficit hyperactivity disorder, predominantly hyperactive type: Secondary | ICD-10-CM | POA: Diagnosis not present

## 2024-07-31 DIAGNOSIS — R4689 Other symptoms and signs involving appearance and behavior: Secondary | ICD-10-CM | POA: Diagnosis present

## 2024-07-31 LAB — COMPREHENSIVE METABOLIC PANEL WITH GFR
ALT: 12 U/L (ref 0–44)
AST: 20 U/L (ref 15–41)
Albumin: 4.5 g/dL (ref 3.5–5.0)
Alkaline Phosphatase: 50 U/L (ref 38–126)
Anion gap: 11 (ref 5–15)
BUN: 9 mg/dL (ref 6–20)
CO2: 27 mmol/L (ref 22–32)
Calcium: 9.5 mg/dL (ref 8.9–10.3)
Chloride: 104 mmol/L (ref 98–111)
Creatinine, Ser: 0.97 mg/dL (ref 0.61–1.24)
GFR, Estimated: >60 mL/min (ref >60)
Glucose, Bld: 96 mg/dL (ref 70–99)
Potassium: 3.7 mmol/L (ref 3.5–5.1)
Sodium: 142 mmol/L (ref 135–145)
Total Bilirubin: <0.2 mg/dL (ref 0.0–1.2)
Total Protein: 7.2 g/dL (ref 6.5–8.1)

## 2024-07-31 LAB — URINE DRUG SCREEN
Amphetamines: NEGATIVE
Barbiturates: NEGATIVE
Benzodiazepines: NEGATIVE
Cocaine: NEGATIVE
Fentanyl: NEGATIVE
Methadone Scn, Ur: NEGATIVE
Opiates: NEGATIVE
Tetrahydrocannabinol: NEGATIVE

## 2024-07-31 LAB — CBC WITH DIFFERENTIAL/PLATELET
Abs Immature Granulocytes: 0.02 K/uL (ref 0.00–0.07)
Basophils Absolute: 0 K/uL (ref 0.0–0.1)
Basophils Relative: 0 %
Eosinophils Absolute: 0.2 K/uL (ref 0.0–0.5)
Eosinophils Relative: 2 %
HCT: 38 % — ABNORMAL LOW (ref 39.0–52.0)
Hemoglobin: 12.8 g/dL — ABNORMAL LOW (ref 13.0–17.0)
Immature Granulocytes: 0 %
Lymphocytes Relative: 30 %
Lymphs Abs: 2.2 K/uL (ref 0.7–4.0)
MCH: 30 pg (ref 26.0–34.0)
MCHC: 33.7 g/dL (ref 30.0–36.0)
MCV: 89 fL (ref 80.0–100.0)
Monocytes Absolute: 0.7 K/uL (ref 0.1–1.0)
Monocytes Relative: 10 %
Neutro Abs: 4.3 K/uL (ref 1.7–7.7)
Neutrophils Relative %: 58 %
Platelets: 104 K/uL — ABNORMAL LOW (ref 150–400)
RBC: 4.27 MIL/uL (ref 4.22–5.81)
RDW: 12.1 % (ref 11.5–15.5)
WBC: 7.5 K/uL (ref 4.0–10.5)
nRBC: 0 % (ref 0.0–0.2)

## 2024-07-31 LAB — ETHANOL: Alcohol, Ethyl (B): <15 mg/dL (ref <15)

## 2024-07-31 LAB — VALPROIC ACID LEVEL: Valproic Acid Lvl: 50 ug/mL (ref 50–100)

## 2024-07-31 MED ORDER — ONDANSETRON HCL 4 MG PO TABS: 4.0000 mg | ORAL_TABLET | Freq: Three times a day (TID) | ORAL | Status: DC | PRN

## 2024-07-31 MED ORDER — ALUM & MAG HYDROXIDE-SIMETH 200-200-20 MG/5ML PO SUSP: 30.0000 mL | Freq: Four times a day (QID) | ORAL | Status: DC | PRN

## 2024-07-31 MED ORDER — IBUPROFEN 600 MG PO TABS: 600.0000 mg | ORAL_TABLET | Freq: Three times a day (TID) | ORAL | Status: DC | PRN

## 2024-07-31 NOTE — ED Notes (Signed)
 VOL/  PENDING  CONSULT
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
## 2024-07-31 NOTE — ED Notes (Signed)
 This tech obtained vital signs on pt.

## 2024-07-31 NOTE — ED Notes (Signed)
Unable to draw labs at this time. ?

## 2024-07-31 NOTE — ED Notes (Signed)
 Pt given snack and beverage.

## 2024-07-31 NOTE — ED Notes (Signed)
 Pt dressed in hospital scrubs. Pt belongings include:  Yellow shirt Yellow jacket Black belt Black shoes White cocks Khaki pants [employer]

## 2024-07-31 NOTE — ED Notes (Signed)
 Pt provided dinner tray and a beverage.

## 2024-07-31 NOTE — Consult Note (Signed)
 Psychiatry consult received and pending completion of workup. Labs not yet collected (nursing notes show unable to draw labs at this time). This provider added orders for EKG to assess QTc (Thorazine  therapy noted in previous notes) and valproic acid  level given documented history of Depakote  therapy in prior notes. Per triage documentation, patient reportedly not taking prescribed medications--unclear if medications were formally discontinued or represents patient non-adherence. Legal guardian documented in chart. Psychiatry assessment deferred pending receipt of lab results, medication levels, and EKG for treatment planning.
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
## 2024-08-01 ENCOUNTER — Encounter: Payer: Self-pay | Admitting: Psychiatry

## 2024-08-01 DIAGNOSIS — F419 Anxiety disorder, unspecified: Secondary | ICD-10-CM

## 2024-08-01 DIAGNOSIS — F909 Attention-deficit hyperactivity disorder, unspecified type: Secondary | ICD-10-CM

## 2024-08-01 DIAGNOSIS — F71 Moderate intellectual disabilities: Secondary | ICD-10-CM

## 2024-08-01 MED ORDER — DIVALPROEX SODIUM 250 MG PO DR TAB
750.0000 mg | DELAYED_RELEASE_TABLET | Freq: Every day | ORAL | Status: DC
  Administered 2024-08-01: 21:00:00 750 mg via ORAL
  Filled 2024-08-01: qty 3

## 2024-08-01 MED ORDER — CHLORPROMAZINE HCL 50 MG PO TABS
25.0000 mg | ORAL_TABLET | Freq: Two times a day (BID) | ORAL | Status: DC
  Administered 2024-08-01 – 2024-08-02 (×3): 25 mg via ORAL
  Filled 2024-08-01 (×4): qty 1

## 2024-08-01 MED ORDER — DIPHENHYDRAMINE HCL 25 MG PO CAPS: 25.0000 mg | ORAL_CAPSULE | Freq: Four times a day (QID) | ORAL | Status: DC | PRN

## 2024-08-01 MED ORDER — CLONAZEPAM 1 MG PO TABS
1.0000 mg | ORAL_TABLET | Freq: Two times a day (BID) | ORAL | Status: DC
  Administered 2024-08-01 – 2024-08-02 (×3): 1 mg via ORAL
  Filled 2024-08-01: qty 1
  Filled 2024-08-01 (×2): qty 2

## 2024-08-01 MED ORDER — ALBUTEROL SULFATE HFA 108 (90 BASE) MCG/ACT IN AERS: 2.0000 | INHALATION_SPRAY | Freq: Four times a day (QID) | RESPIRATORY_TRACT | Status: DC | PRN

## 2024-08-01 MED ORDER — DIVALPROEX SODIUM 250 MG PO DR TAB
250.0000 mg | DELAYED_RELEASE_TABLET | Freq: Every day | ORAL | Status: DC
  Administered 2024-08-02: 250 mg via ORAL
  Filled 2024-08-01: qty 1

## 2024-08-01 MED ORDER — LEVOTHYROXINE SODIUM 112 MCG PO TABS
112.0000 ug | ORAL_TABLET | Freq: Every day | ORAL | Status: DC
  Administered 2024-08-02: 112 ug via ORAL
  Filled 2024-08-01: qty 1

## 2024-08-01 MED ORDER — FERROUS SULFATE 325 (65 FE) MG PO TBEC
325.0000 mg | ORAL_TABLET | Freq: Every day | ORAL | Status: DC
  Administered 2024-08-02: 325 mg via ORAL
  Filled 2024-08-01 (×2): qty 1

## 2024-08-01 MED ORDER — CLONAZEPAM 0.5 MG PO TABS: 1.0000 mg | ORAL_TABLET | Freq: Two times a day (BID) | ORAL | Status: DC

## 2024-08-01 MED ORDER — DIVALPROEX SODIUM 250 MG PO DR TAB: 250.0000 mg | DELAYED_RELEASE_TABLET | Freq: Every day | ORAL | Status: DC

## 2024-08-01 MED ORDER — DOCUSATE SODIUM 100 MG PO CAPS
100.0000 mg | ORAL_CAPSULE | Freq: Every day | ORAL | Status: DC
  Administered 2024-08-02: 100 mg via ORAL
  Filled 2024-08-01: qty 1

## 2024-08-01 MED ORDER — FLUVOXAMINE MALEATE 50 MG PO TABS
50.0000 mg | ORAL_TABLET | Freq: Every day | ORAL | Status: DC
  Administered 2024-08-01: 21:00:00 50 mg via ORAL
  Filled 2024-08-01 (×2): qty 1

## 2024-08-01 MED ORDER — OLANZAPINE 5 MG PO TBDP: 10.0000 mg | ORAL_TABLET | Freq: Two times a day (BID) | ORAL | Status: DC | PRN

## 2024-08-01 NOTE — ED Notes (Signed)
 Vol / recommend observation in ed until can reach legal guardian for discharge planning
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
## 2024-08-01 NOTE — ED Notes (Signed)
 Hospital meal provided, pt tolerated w/o complaints.  Waste discarded appropriately.

## 2024-08-01 NOTE — ED Notes (Signed)
 Meal provided

## 2024-08-01 NOTE — ED Notes (Signed)
 Patient in assigned room watching tv
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
## 2024-08-01 NOTE — ED Notes (Signed)
Patient watching tv.

## 2024-08-01 NOTE — ED Notes (Signed)
 Social worker and pt advocate at bedside at this time.

## 2024-08-01 NOTE — ED Provider Notes (Signed)
 Emergency Medicine Observation Re-evaluation Note   BP 127/74 (BP Location: Left Arm)   Pulse (!) 115   Temp 98.1 F (36.7 C) (Oral)   Resp 18   Ht 5' 6 (1.676 m)   Wt 77.1 kg   SpO2 99%   BMI 27.43 kg/m    ED Course / MDM   No reported events during my shift at the time of this note.   Pt is awaiting dispo from consultants   Ginnie Shams MD    Shams Ginnie, MD 08/01/24 [phone]

## 2024-08-01 NOTE — ED Notes (Signed)
 Vol pending discharge when research complete

## 2024-08-01 NOTE — ED Notes (Signed)
 TTS spoke with Darryl S. From [place] [facility].  A report was given by TTS provider supplying contact information for the group home and the Legal guardian. A summary of what was reported by the patient was also provided to the Child psychotherapist.

## 2024-08-01 NOTE — ED Notes (Signed)
 TTS at bedside with patient at this time.

## 2024-08-01 NOTE — ED Notes (Signed)
 TTS contacted [place] DSS [phone]).  The office was closed. A message was left with central communication for the on-call staff to contact TTS.
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
## 2024-08-02 DIAGNOSIS — F901 Attention-deficit hyperactivity disorder, predominantly hyperactive type: Secondary | ICD-10-CM

## 2024-08-02 DIAGNOSIS — F419 Anxiety disorder, unspecified: Secondary | ICD-10-CM | POA: Diagnosis not present

## 2024-08-02 DIAGNOSIS — F71 Moderate intellectual disabilities: Secondary | ICD-10-CM

## 2024-08-02 NOTE — Discharge Instructions (Signed)
 Return to the ER for any new or worsening symptoms that concern you, especially any thoughts of wanting to hurt yourself or others.  Follow-up with your outpatient primary care provider.

## 2024-08-02 NOTE — ED Provider Notes (Signed)
----------------------------------------- °  11:14 AM on 08/02/2024 -----------------------------------------  NP Claudene from psychiatry has evaluated patient and cleared him for discharge.  The patient is stable for discharge at this time.  Return precautions have been provided.   Jacolyn Pae, MD 08/02/24 1114

## 2024-08-02 NOTE — ED Notes (Signed)
 [facility] worker here for patient

## 2024-08-02 NOTE — Progress Notes (Signed)
 Per Zelda NP,  patient is psych cleared for discharge back to group home.  [facility] updated legal guardian Gearld Larve - [phone]) and Group Home owner Zaccaro Corunna [phone]). Ms. Dannial stated she would be sending staff over now to pick pt up.    [place], [facility] [phone]

## 2024-08-02 NOTE — ED Notes (Signed)
 Dinner tray provided to pt

## 2024-08-02 NOTE — Progress Notes (Addendum)
 12:28PM - [facility] received call from group home owner stating her staff was in the ED for over an hour however pt was not brought out for pick up.  [facility] communicated this to [place], [employer] (Please also refer to 11:22AM note).  Group home staff stated she had to leave for another preplanned appointment at social security office.  Katheren RN stated patient is now with Powell, RN.    1:13PM [facility] contacted Heather RN to report that [facility] staff (Ms. Antonetta [phone]) will attempt to pick patient up again when she returns from social security office.  [facility] provided Ms. Yancey's contact # to [employer], [employer].  [place], [facility] [phone]

## 2024-08-02 NOTE — ED Notes (Signed)
Waiting on ride.

## 2024-08-02 NOTE — Consult Note (Signed)
 Darryl Hickman  Patient Name: [name]  MRN: [id number]  DOB: 1996-05-03  Consult Order details:  Orders (From admission, onward)     Start     Ordered   07/31/24 1545  CONSULT TO CALL ACT TEAM       Ordering Provider: Viviann Pastor, MD  Provider:  (Not yet assigned)  Question:  Reason for Consult?  Answer:  Psych consult   07/31/24 1544   07/31/24 1545  IP CONSULT TO PSYCHIATRY       Ordering Provider: Viviann Pastor, MD  Provider:  (Not yet assigned)  Question Answer Comment  Consult Timeframe URGENT - requires response within 12 hours   URGENT timeframe requires provider to provider communication, has the provider to provider communication been completed Yes   Reason for Consult? Consult for medication management   Contact phone number where the requesting provider can be reached [phone]      07/31/24 1544             Mode of Visit: In person    Psychiatry Consult Evaluation  Service Date: August 02, 2024 LOS:  LOS: 0 days  Chief Complaint Aggressive behaviors  Primary Psychiatric Diagnoses   Aggressive behavior   Attention deficit hyperactivity disorder (ADHD)   Anxiety   Intellectual developmental disorder, moderate     Assessment   Patient presents following behavioral incident in community setting. Initial evaluation by telehealth psychiatry team concluded that patient's aggressive behaviors are best understood in the context of intellectual and developmental disability with limited coping skills and impaired adaptive functioning, rather than manifestation of an acute primary psychiatric disorder (reference initial telehealth note for extended details).  Current psychiatric examination reveals no evidence of acute psychosis, mania, severe mood disorder, or active suicidal/homicidal ideation that would benefit from or necessitate inpatient psychiatric hospitalization. Patient's behavioral presentation has remained  stable and appropriate throughout ED observation period without psychiatric intervention. As noted by initial evaluating providers, inpatient psychiatric admission is unlikely to be therapeutic for this patient and may potentially exacerbate behavioral dysregulation due to environmental overstimulation and lack of specialized behavioral supports available in acute psychiatric settings. Patient's needs are more appropriately addressed through behavioral interventions, environmental modifications, consistent structure, and specialized IDD-informed care in his familiar group home setting.  Patient is psychiatrically stable for discharge. While future psychiatric events cannot be accurately predicted, the patient does not currently require acute inpatient psychiatric care and does not currently meet [facility]  involuntary commitment criteria.     Diagnoses:  Active Hospital problems: Active Problems:   Aggressive behavior   Attention deficit hyperactivity disorder (ADHD)   Anxiety   Intellectual developmental disorder, moderate    Plan   ## Psychiatric Medication Recommendations:  N/A  ## Medical Decision Making Capacity: Patient has a guardian and has thus been adjudicated incompetent; please involve patients guardian in medical decision making     ## Disposition:--Patient is psychiatrically cleared for discharge back to group home  ## Behavioral / Environmental: -Utilize compassion and acknowledge the patient's experiences while setting clear and realistic expectations for care.    ## Safety and Observation Level:  - Based on my clinical evaluation, I estimate the patient to be at low risk of self harm in the current setting. - At this time, we recommend  routine. This decision is based on my review of the chart including patient's history and current presentation, interview of the patient, mental status examination, and consideration of suicide risk including evaluating suicidal  ideation, plan, intent, suicidal or self-harm behaviors, risk factors, and protective factors. This judgment is based on our ability to directly address suicide risk, implement suicide prevention strategies, and develop a safety plan while the patient is in the clinical setting. Please contact our team if there is a concern that risk level has changed.  CSSR Risk Category:C-SSRS RISK CATEGORY: No Risk  Suicide Risk Assessment: Patient has following modifiable risk factors for suicide: triggering events, which we are addressing by utilizing therapeutic communication for patient to be able to discuss events. Patient has following non-modifiable or demographic risk factors for suicide: male gender Patient has the following protective factors against suicide: Access to outpatient mental health care structured monitored environment provided by group home  Thank you for this consult request. Recommendations have been communicated to the primary team.  We will sign off at this time.   Darryl Sharps, NP        History of Present Illness  Relevant Aspects of Hospital ED   Patient Report:  Patient is a 28 year old male with history of intellectual and developmental disability (IDD) who presents following behavioral incident in community setting. Patient was initially evaluated by telehealth psychiatry providers while in the emergency department. Per their comprehensive assessment, it was determined that patient's aggressive behaviors are best understood in the context of his intellectual and developmental disability with associated limited coping skills and impaired adaptive functioning, rather than manifestation of an acute primary psychiatric disorder. The initial evaluating providers concluded there was no evidence of acute psychosis, mania, severe mood disorder, or suicidal/homicidal intent that would benefit from inpatient psychiatric hospitalization. They noted that inpatient psychiatric admission would  be unlikely to provide therapeutic benefit and could potentially exacerbate behavioral dysregulation due to environmental overstimulation and lack of specialized behavioral supports typically available in IDD-focused settings. The telehealth team recommended that patient's needs would be more appropriately addressed through behavioral interventions, environmental modifications, consistent structure, and specialized IDD-informed care in his familiar setting, rather than placement in an acute psychiatric unit. Initial recommendations included observation in the emergency department until legal guardian could be reached for discharge planning (reference initial telehealth note for expanded details).  Throughout patient's stay in the emergency department, he has consistently maintained safe behaviors without incident. Per review of chart documentation and report from nursing staff, patient has not displayed any aggressive behaviors toward staff or other patients during hospitalization. He has been consistently calm and cooperative with both staff and other patients throughout his ED stay.  On today's in-person psychiatric assessment, patient was cooperative with this provider. When asked about his mood, patient reported feeling good. He denied current suicidal or homicidal ideation and denied experiencing auditory or visual hallucinations. Patient reports he is taking his medications as prescribed and denied any noted side effects. An attempt was made to discuss alternative coping strategies to utilize when upset, however, patient's comprehension and engagement during this discussion were limited due to his noted history of intellectual and developmental disability, which is consistent with the initial provider's assessment that traditional psychiatric interventions are unlikely to be effective or beneficial.  On mental status examination, patient is alert and oriented to person and place, which is consistent  with his baseline functioning given his IDD. He remains cooperative and calm throughout the examination. When asked about mood, patient states good. Affect is euthymic and appropriate to content. Speech is normal in rate and volume, though responses are simplistic and concrete, consistent with his cognitive limitations. Thought process is notably concrete,  as expected given his developmental disability. Regarding thought content, patient denies suicidal ideation, homicidal ideation, and perceptual disturbances. There is no evidence of psychosis or mania on current examination, and patient does not appear to be responding to internal stimuli. His behavior throughout the assessment remains calm, cooperative, and without any signs of aggression.  Our Therapeutic Transitional Services (TTS) team and behavioral health coordinator have been working closely with patient's legal guardian and group home representatives throughout this hospitalization to coordinate appropriate discharge planning and ensure continuity of specialized behavioral supports in his community setting.      Psychiatric and Social History  Psychiatric History:  Collected by [facility] telehealth providers on initial presentation-please reference their initial note for further details.  Exam Findings  Physical Exam: Reviewed and agree with the physical exam findings conducted by the medical provider Vital Signs:  Temp:  [97.9 F (36.6 C)-98.3 F (36.8 C)] 97.9 F (36.6 C) (12/19 0805) Pulse Rate:  [86-87] 86 (12/19 0805) Resp:  [15-16] 15 (12/19 0805) BP: (121-123)/(78-80) 121/80 (12/19 0805) SpO2:  [99 %-100 %] 100 % (12/19 0805) Blood pressure 121/80, pulse 86, temperature 97.9 F (36.6 C), temperature source Oral, resp. rate 15, height 5' 6 (1.676 m), weight 77.1 kg, SpO2 100%. Body mass index is 27.43 kg/m.        Other History   These have been pulled in through the EMR, reviewed, and updated if appropriate.  Family  History:  The patient's family history includes Anemia in his mother; Diabetes in his mother.  Medical History: Past Medical History:  Diagnosis Date   ADHD    Asthma    Mental retardation, mild (I.Q. 50-70)     Surgical History: Past Surgical History:  Procedure Laterality Date   ORCHIECTOMY Right 06/22/2016   Procedure: INGUINAL ORCHIECTOMY;  Surgeon: Belvie LITTIE Clara, MD;  Location: [facility];  Service: Urology;  Laterality: Right;     Medications:  Current Medications[1]  Allergies: Allergies[2]  Darryl Sharps, NP This note was created using Dragon dictation software. Please excuse any inadvertent transcription errors. Case was discussed with supervising physician Dr. Jadapalle who is agreeable with current plan.       [1]  Current Facility-Administered Medications:    albuterol  (VENTOLIN  HFA) 108 (90 Base) MCG/ACT inhaler 2 puff, 2 puff, Inhalation, Q6H PRN, Dicky Anes, MD   alum & mag hydroxide-simeth (MAALOX/MYLANTA) 200-200-20 MG/5ML suspension 30 mL, 30 mL, Oral, Q6H PRN, Darryl Pastor, MD   chlorproMAZINE  (THORAZINE ) tablet 25 mg, 25 mg, Oral, BID, Moore, Virginia  G, NP, 25 mg at 08/02/24 0801   clonazePAM  (KLONOPIN ) tablet 1 mg, 1 mg, Oral, BID, Cyrena Mylar, MD, 1 mg at 08/02/24 0800   diphenhydrAMINE  (BENADRYL ) capsule 25 mg, 25 mg, Oral, Q6H PRN, Moore, Virginia  G, NP   divalproex  (DEPAKOTE ) DR tablet 250 mg, 250 mg, Oral, Q breakfast, Cyrena Mylar, MD, 250 mg at 08/02/24 0800   divalproex  (DEPAKOTE ) DR tablet 750 mg, 750 mg, Oral, QHS, Moore, Virginia  G, NP, 750 mg at 08/01/24 2119   docusate sodium  (COLACE) capsule 100 mg, 100 mg, Oral, Daily, Quale, Mark, MD, 100 mg at 08/02/24 1003   ferrous sulfate  tablet 325 mg, 325 mg, Oral, Q breakfast, Quale, Anes, MD, 325 mg at 08/02/24 0801   fluvoxaMINE  (LUVOX ) tablet 50 mg, 50 mg, Oral, QHS, Moore, Virginia  G, NP, 50 mg at 08/01/24 2121   ibuprofen  (ADVIL ) tablet 600 mg, 600 mg, Oral, Q8H PRN, Darryl Pastor,  MD   levothyroxine  (SYNTHROID ) tablet 112 mcg,  112 mcg, Oral, Q0600, Quale, Mark, MD, 112 mcg at 08/02/24 0617   OLANZapine  zydis (ZYPREXA ) disintegrating tablet 10 mg, 10 mg, Oral, BID PRN, Moore, Virginia  G, NP   ondansetron  (ZOFRAN ) tablet 4 mg, 4 mg, Oral, Q8H PRN, Darryl Pastor, MD  Current Outpatient Medications:    cetirizine  (ZYRTEC ) 10 MG tablet, TAKE ONE TABLET BY MOUTH EACH MORNING., Disp: 30 tablet, Rfl: 5   chlorproMAZINE  (THORAZINE ) 25 MG tablet, Take 25 mg by mouth 2 (two) times daily., Disp: , Rfl:    clonazePAM  (KLONOPIN ) 1 MG tablet, Take 1 mg by mouth 2 (two) times daily as needed., Disp: , Rfl:    cloNIDine (CATAPRES) 0.1 MG tablet, Take 0.1 mg by mouth 2 (two) times daily., Disp: , Rfl:    divalproex  (DEPAKOTE  ER) 500 MG 24 hr tablet, Take 500 mg by mouth at bedtime. Take in addition to one 250 mg tablet for total 750 mg at bedtime, Disp: , Rfl:    divalproex  (DEPAKOTE ) 250 MG DR tablet, Take 250 mg by mouth 2 (two) times daily. Take 250 mg by mouth every morning and take in addition to one 500 mg tablet for total 750 mg at bedtime, Disp: , Rfl:    docusate sodium  (COLACE) 100 MG capsule, Take 100 mg by mouth daily., Disp: , Rfl:    ferrous sulfate  325 (65 FE) MG tablet, Take 325 mg by mouth daily with breakfast., Disp: , Rfl:    fluvoxaMINE  (LUVOX ) 50 MG tablet, Take 50 mg by mouth at bedtime., Disp: , Rfl:    hydrOXYzine  (VISTARIL ) 50 MG capsule, Take 50 mg by mouth at bedtime., Disp: , Rfl:    icosapent Ethyl (VASCEPA) 1 g capsule, Take 2 g by mouth 2 (two) times daily., Disp: , Rfl:    levothyroxine  (SYNTHROID ) 112 MCG tablet, Take 112 mcg by mouth daily., Disp: , Rfl:    melatonin 3 MG TABS tablet, Take 3 mg by mouth at bedtime., Disp: , Rfl:    omeprazole (PRILOSEC) 40 MG capsule, Take 40 mg by mouth daily., Disp: , Rfl:    polyethylene glycol powder (GLYCOLAX /MIRALAX ) powder, One scoop daily, Disp: 3350 g, Rfl: 5   prazosin (MINIPRESS) 2 MG capsule, Take 2 mg by  mouth at bedtime., Disp: , Rfl:    risperiDONE (RISPERDAL) 1 MG tablet, Take 1 mg by mouth 2 (two) times daily., Disp: , Rfl:    VENTOLIN  HFA 108 (90 Base) MCG/ACT inhaler, Inhale into the lungs., Disp: , Rfl:  [2]  Allergies Allergen Reactions   Sulfa  Antibiotics    Dimetapp Cold-Allergy [Brompheniramine-Phenylephrine] Itching

## 2024-08-02 NOTE — Progress Notes (Signed)
 [facility] received call from Ms. [facility] (Group Home staff [phone]).  Ms. Antonetta reported that she is currently leaving [facility] and should arrive at the ED in about 30 minutes to pick patient up.  Powell, RN was updated via chat.  [place], [facility] [phone]

## 2024-08-02 NOTE — ED Notes (Signed)
 Phone was provided to pt per request.

## 2024-08-02 NOTE — ED Notes (Signed)
 Attempted to call Ms. Darryl Hickman, no answer and voicemail is full

## 2024-08-02 NOTE — Progress Notes (Addendum)
 [facility] received call from Ms. Antonetta stating that she is here from the Group Home and waiting to pick up patient.  [facility] updated Katheren, RN who stated patient would be brought to ED Lobby once EDP clears pt for discharge. [facility] updated Ms. Antonetta.   [place], [facility] [phone]

## 2024-08-02 NOTE — ED Notes (Signed)
Pt given lunch tray and beverage 

## 2024-08-02 NOTE — ED Provider Notes (Signed)
 Emergency Medicine Observation Re-evaluation Note   BP 123/78   Pulse 87   Temp 98.3 F (36.8 C)   Resp 16   Ht 5' 6 (1.676 m)   Wt 77.1 kg   SpO2 99%   BMI 27.43 kg/m    ED Course / MDM   No reported events during my shift at the time of this note.   Pt is awaiting dispo from consultants   Ginnie Shams MD    Shams Ginnie, MD 08/02/24 [phone]

## 2024-08-02 NOTE — ED Notes (Signed)
 Spoke with Bartholome [phone]) at [facility] stated someone is otw right now to get patient

## 2024-08-02 NOTE — ED Notes (Signed)
 Breakfast tray provided.

## 2024-08-02 NOTE — Progress Notes (Addendum)
 [facility] updated patient's new shift RN Jerilynn).  Melanie, RN stated she was unsuccessful when attempted to reach Ms. [facility] staff), however she will continue to try to coordinate patient's pickup.  [place], [facility] [phone]

## 2024-08-17 ENCOUNTER — Other Ambulatory Visit: Payer: Self-pay

## 2024-08-17 ENCOUNTER — Emergency Department
Admission: EM | Admit: 2024-08-17 | Discharge: 2024-08-23 | Disposition: A | Discharge status: Home / Self Care | Payer: MEDICAID

## 2024-08-17 DIAGNOSIS — F919 Conduct disorder, unspecified: Secondary | ICD-10-CM | POA: Diagnosis not present

## 2024-08-17 DIAGNOSIS — F1310 Sedative, hypnotic or anxiolytic abuse, uncomplicated: Secondary | ICD-10-CM | POA: Diagnosis not present

## 2024-08-17 DIAGNOSIS — Y900 Blood alcohol level of less than 20 mg/100 ml: Secondary | ICD-10-CM | POA: Diagnosis not present

## 2024-08-17 DIAGNOSIS — R4781 Slurred speech: Secondary | ICD-10-CM | POA: Insufficient documentation

## 2024-08-17 DIAGNOSIS — R4689 Other symptoms and signs involving appearance and behavior: Secondary | ICD-10-CM | POA: Insufficient documentation

## 2024-08-17 DIAGNOSIS — R456 Violent behavior: Secondary | ICD-10-CM | POA: Insufficient documentation

## 2024-08-17 DIAGNOSIS — R45851 Suicidal ideations: Secondary | ICD-10-CM | POA: Diagnosis not present

## 2024-08-17 LAB — COMPREHENSIVE METABOLIC PANEL WITH GFR
ALT: 11 U/L (ref 0–44)
AST: 21 U/L (ref 15–41)
Albumin: 4.4 g/dL (ref 3.5–5.0)
Alkaline Phosphatase: 46 U/L (ref 38–126)
Anion gap: 10 (ref 5–15)
BUN: 11 mg/dL (ref 6–20)
CO2: 26 mmol/L (ref 22–32)
Calcium: 9.5 mg/dL (ref 8.9–10.3)
Chloride: 102 mmol/L (ref 98–111)
Creatinine, Ser: 0.99 mg/dL (ref 0.61–1.24)
GFR, Estimated: >60 mL/min
Glucose, Bld: 95 mg/dL (ref 70–99)
Potassium: 4.4 mmol/L (ref 3.5–5.1)
Sodium: 138 mmol/L (ref 135–145)
Total Bilirubin: 0.2 mg/dL (ref 0.0–1.2)
Total Protein: 7.1 g/dL (ref 6.5–8.1)

## 2024-08-17 LAB — CBC
HCT: 38.8 % — ABNORMAL LOW (ref 39.0–52.0)
Hemoglobin: 12.8 g/dL — ABNORMAL LOW (ref 13.0–17.0)
MCH: 29.8 pg (ref 26.0–34.0)
MCHC: 33 g/dL (ref 30.0–36.0)
MCV: 90.4 fL (ref 80.0–100.0)
Platelets: 108 K/uL — ABNORMAL LOW (ref 150–400)
RBC: 4.29 MIL/uL (ref 4.22–5.81)
RDW: 12.3 % (ref 11.5–15.5)
WBC: 6.9 K/uL (ref 4.0–10.5)
nRBC: 0 % (ref 0.0–0.2)

## 2024-08-17 LAB — URINE DRUG SCREEN
Amphetamines: NEGATIVE
Barbiturates: NEGATIVE
Benzodiazepines: POSITIVE — AB
Cocaine: NEGATIVE
Fentanyl: NEGATIVE
Methadone Scn, Ur: NEGATIVE
Opiates: NEGATIVE
Tetrahydrocannabinol: NEGATIVE

## 2024-08-17 LAB — ETHANOL: Alcohol, Ethyl (B): <15 mg/dL

## 2024-08-17 MED ORDER — DOCUSATE SODIUM 100 MG PO CAPS
100.0000 mg | ORAL_CAPSULE | Freq: Every day | ORAL | Status: DC
  Administered 2024-08-18 – 2024-08-23 (×6): 100 mg via ORAL
  Filled 2024-08-17 (×6): qty 1

## 2024-08-17 MED ORDER — MELATONIN 5 MG PO TABS
5.0000 mg | ORAL_TABLET | Freq: Every day | ORAL | Status: DC
  Administered 2024-08-18 – 2024-08-22 (×6): 5 mg via ORAL
  Filled 2024-08-17 (×6): qty 1

## 2024-08-17 MED ORDER — ICOSAPENT ETHYL 1 G PO CAPS
2.0000 g | ORAL_CAPSULE | Freq: Two times a day (BID) | ORAL | Status: DC
  Administered 2024-08-18 – 2024-08-23 (×11): 2 g via ORAL
  Filled 2024-08-17 (×14): qty 2

## 2024-08-17 MED ORDER — DIVALPROEX SODIUM 250 MG PO DR TAB
250.0000 mg | DELAYED_RELEASE_TABLET | ORAL | Status: DC
  Administered 2024-08-18 – 2024-08-23 (×12): 250 mg via ORAL
  Filled 2024-08-17 (×12): qty 1

## 2024-08-17 MED ORDER — FLUVOXAMINE MALEATE 50 MG PO TABS
50.0000 mg | ORAL_TABLET | Freq: Every evening | ORAL | Status: DC
  Administered 2024-08-18 – 2024-08-22 (×5): 50 mg via ORAL
  Filled 2024-08-17 (×7): qty 1

## 2024-08-17 MED ORDER — PANTOPRAZOLE SODIUM 40 MG PO TBEC
40.0000 mg | DELAYED_RELEASE_TABLET | Freq: Every day | ORAL | Status: DC
  Administered 2024-08-18 – 2024-08-23 (×6): 40 mg via ORAL
  Filled 2024-08-17 (×6): qty 1

## 2024-08-17 MED ORDER — HYDROXYZINE HCL 25 MG PO TABS
50.0000 mg | ORAL_TABLET | Freq: Every evening | ORAL | Status: DC
  Administered 2024-08-18 – 2024-08-22 (×5): 50 mg via ORAL
  Filled 2024-08-17 (×5): qty 2

## 2024-08-17 MED ORDER — RISPERIDONE 1 MG PO TABS
1.0000 mg | ORAL_TABLET | Freq: Two times a day (BID) | ORAL | Status: DC
  Administered 2024-08-18 – 2024-08-23 (×12): 1 mg via ORAL
  Filled 2024-08-17 (×12): qty 1

## 2024-08-17 MED ORDER — LORATADINE 10 MG PO TABS
10.0000 mg | ORAL_TABLET | Freq: Every day | ORAL | Status: DC
  Administered 2024-08-18 – 2024-08-23 (×6): 10 mg via ORAL
  Filled 2024-08-17 (×6): qty 1

## 2024-08-17 MED ORDER — CLONAZEPAM 1 MG PO TABS
1.0000 mg | ORAL_TABLET | Freq: Two times a day (BID) | ORAL | Status: DC
  Administered 2024-08-18 – 2024-08-23 (×11): 1 mg via ORAL
  Filled 2024-08-17 (×2): qty 1
  Filled 2024-08-17: qty 2
  Filled 2024-08-17 (×4): qty 1
  Filled 2024-08-17: qty 2
  Filled 2024-08-17 (×3): qty 1

## 2024-08-17 MED ORDER — PRAZOSIN HCL 1 MG PO CAPS
2.0000 mg | ORAL_CAPSULE | Freq: Every day | ORAL | Status: DC
  Administered 2024-08-18 – 2024-08-22 (×6): 2 mg via ORAL
  Filled 2024-08-17: qty 1
  Filled 2024-08-17 (×3): qty 2
  Filled 2024-08-17: qty 1
  Filled 2024-08-17: qty 2
  Filled 2024-08-17: qty 1

## 2024-08-17 MED ORDER — LEVOTHYROXINE SODIUM 112 MCG PO TABS
112.0000 ug | ORAL_TABLET | Freq: Every day | ORAL | Status: DC
  Administered 2024-08-18 – 2024-08-22 (×5): 112 ug via ORAL
  Filled 2024-08-17 (×7): qty 1

## 2024-08-17 MED ORDER — DIVALPROEX SODIUM ER 500 MG PO TB24
500.0000 mg | ORAL_TABLET | Freq: Every day | ORAL | Status: DC
  Administered 2024-08-18 – 2024-08-22 (×6): 500 mg via ORAL
  Filled 2024-08-17 (×2): qty 1
  Filled 2024-08-17: qty 2
  Filled 2024-08-17 (×2): qty 1
  Filled 2024-08-17: qty 2

## 2024-08-17 NOTE — ED Notes (Addendum)
 Pt dressed out by this RN and EDT:  1 blu ejean jacket,  1 blue hat Pair of blue shoes 1 pair grey/black socks  1 pair blue jeans 1 multi color jersey  1 white under shirt  1 pair blue underwear 1 black belt
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
## 2024-08-17 NOTE — ED Notes (Signed)
 Patient is IVC pending consult

## 2024-08-17 NOTE — ED Notes (Signed)
 Attempted to contact group home Administrator Valentin Leek [phone]) went to voice mail and states mail box was full and no message could be left.

## 2024-08-17 NOTE — ED Notes (Signed)
 Called Rumaldo Schooling, patient's listed legal guardian [phone].  No answer and no message left will attempt the after hours number.
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
## 2024-08-17 NOTE — ED Notes (Signed)
 TTS in with patient.
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
## 2024-08-17 NOTE — ED Notes (Signed)
 Called number listed on chart as contact number and told to call [phone] and speak with [facility] regarding patient.

## 2024-08-17 NOTE — ED Notes (Signed)
 Called [phone] for [facility] spoke with Grayce who stated there was not a fax at the house and she would have to get in touch with her supervisor to get us  the information.

## 2024-08-17 NOTE — ED Notes (Signed)
 After hours number for [facility] was for Darryl Hickman [phone]) notified him that patient was in the ED and why he was here.

## 2024-08-17 NOTE — ED Notes (Signed)
"   Attempted to call TTS at this time regarding pending consult. No answer at this time.  "

## 2024-08-18 DIAGNOSIS — F919 Conduct disorder, unspecified: Secondary | ICD-10-CM | POA: Diagnosis not present

## 2024-08-18 NOTE — ED Notes (Signed)
 Breakfast has been given to patient.

## 2024-08-18 NOTE — ED Notes (Signed)
 This tech obtained vital signs on pt.

## 2024-08-18 NOTE — BH Assessment (Signed)
 Per [facility] Janalyn S.), patient to be referred out of system.  Referral information for Psychiatric Hospitalization faxed to;   [facility] [phone]), no appropriate bed.   Service Provider Phone  [facility]  [phone]  [facility]  [phone]  [facility]  [phone]  [facility]  [phone]  [facility]  [phone]  [facility]  [phone]  [facility]  [phone]  [facility]  [phone]  [facility]  [phone]  [facility]  [phone]  [facility]  [phone]  [place] [facility]  [phone]  [facility]  [phone]  [facility]  [phone]  [facility]  [phone]

## 2024-08-18 NOTE — ED Notes (Signed)
 Dinner tray provided to pt

## 2024-08-18 NOTE — ED Provider Notes (Signed)
 Emergency Medicine Observation Re-evaluation Note  Darryl Hickman is a 29 y.o. male, seen on rounds today.  Pt initially presented to the ED for complaints of IVC  Currently, the patient is no acute distress.   Physical Exam  Blood pressure 120/76, pulse 77, temperature 97.8 F (36.6 C), temperature source Oral, resp. rate 17, SpO2 100%.  Physical Exam General: No apparent distress Pulm: Normal WOB Psych: resting     ED Course / MDM     I have reviewed the labs performed to date as well as medications administered while in observation.  Recent changes in the last 24 hours include none   Plan   Current plan is to continue to wait for psych plan/placement if felt warranted  Patient is under full IVC at this time.   Ernest Ronal BRAVO, MD 08/18/24 [phone]

## 2024-08-18 NOTE — ED Notes (Signed)
 Pt. Is out of the shower.

## 2024-08-18 NOTE — ED Provider Notes (Signed)
 Vitals:   08/17/24 2045 08/18/24 0720  BP: 120/76 115/74  Pulse: 77 (!) 52  Resp: 17 18  Temp: 97.8 F (36.6 C) 97.7 F (36.5 C)  SpO2: 100% 100%     Per Dr. Gail Recommend inpatient psychiatric hospitalization for safety, stabilization, diagnostic clarification, and medication management. Legal Status: Patient meets criteria for involuntary psychiatric commitment due to recent suicidal ideation, aggressive behavior, impaired judgment, and inability to maintain safety in a less restrictive environment.    Darryl Anes, MD 08/18/24 1118

## 2024-08-18 NOTE — ED Notes (Signed)
 Pt is awake and was given a cup of water

## 2024-08-18 NOTE — ED Notes (Signed)
 Pt given snack and beverage.

## 2024-08-18 NOTE — ED Notes (Signed)
 Patient is currently shower at this time, shower essentials have been provided, and new scrubs top/bottoms.

## 2024-08-18 NOTE — ED Notes (Signed)
Patient is IVC pending placement 
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
## 2024-08-18 NOTE — ED Notes (Signed)
 Pt provided with lunch tray. Pt sitting up eating.

## 2024-08-19 DIAGNOSIS — R4689 Other symptoms and signs involving appearance and behavior: Secondary | ICD-10-CM | POA: Insufficient documentation

## 2024-08-19 LAB — VALPROIC ACID LEVEL: Valproic Acid Lvl: 74 ug/mL (ref 50–100)

## 2024-08-19 MED ADMIN — Acetaminophen Tab 325 MG: 975 mg | ORAL | NDC 00904677361

## 2024-08-19 MED FILL — Acetaminophen Tab 325 MG: 975.0000 mg | ORAL | Qty: 3 | Status: AC

## 2024-08-19 NOTE — ED Notes (Signed)
Patient provided with snack at this time.

## 2024-08-19 NOTE — ED Notes (Signed)
 Pt taking shower. Pt was given hygiene items and the following, 1 clean top, 1 clean bottom, with 1 pair of disposable underwear.  Pt changed out into clean clothing.  Staff disposed of all shower supplies.

## 2024-08-19 NOTE — ED Notes (Addendum)
 [name] Guardian Emergency Contact [phone]  RN contacted to provide update. She reports that pt has had a lot of medication changes over the last few weeks, including a mixup which have sparked behaviors in a negative way. She is hoping for inpatient psych admission to have his medications regulated appropriately and get an improvement of behavior.

## 2024-08-19 NOTE — ED Notes (Signed)
 Lunch tray provided to pt.

## 2024-08-19 NOTE — ED Provider Notes (Signed)
 Emergency Medicine Observation Re-evaluation Note  Darryl Hickman is a 29 y.o. male, seen on rounds today.  Pt initially presented to the ED for complaints of IVC  Currently, the patient is is no acute distress. Denies any concerns at this time.  Physical Exam  Blood pressure 133/77, pulse 83, temperature 97.8 F (36.6 C), temperature source Oral, resp. rate 17, SpO2 100%.  Physical Exam: General: No apparent distress Pulm: Normal WOB Neuro: Moving all extremities Psych: Resting comfortably     ED Course / MDM     I have reviewed the labs performed to date as well as medications administered while in observation.  Recent changes in the last 24 hours include: No acute events overnight.  Plan   Current plan: Patient awaiting psychiatric disposition.  The patient has been placed in psychiatric observation due to the need to provide a safe environment for the patient while obtaining psychiatric consultation and evaluation, as well as ongoing medical and medication management to treat the patient's condition.      Bennett Ram, Josette SAILOR, DO 08/19/24 [phone]

## 2024-08-19 NOTE — ED Notes (Signed)

## 2024-08-19 NOTE — ED Notes (Signed)
 Patient provided with dinner. Patient calm and cooperative at this time, eating dinner at table.

## 2024-08-19 NOTE — Consult Note (Signed)
 [facility] Psychiatric Consult Follow-up  Patient Name: .Darryl Hickman  MRN: [id number]  DOB: 05/10/1996  Consult Order details:  Orders (From admission, onward)     Start     Ordered   08/17/24 1313  CONSULT TO CALL ACT TEAM       Ordering Provider: Jossie Artist POUR, MD  Provider:  (Not yet assigned)  Question:  Reason for Consult?  Answer:  Psych consult   08/17/24 1312   08/17/24 1313  IP CONSULT TO PSYCHIATRY       Ordering Provider: Jossie Artist POUR, MD  Provider:  (Not yet assigned)  Question Answer Comment  Consult Timeframe ROUTINE - requires response within 24 hours   Reason for Consult? Consult for medication management   Contact phone number where the requesting provider can be reached [phone]      08/17/24 1312             Mode of Visit: In person    Psychiatry Consult Evaluation  Service Date: August 19, 2024 LOS:  LOS: 0 days  Chief Complaint Aggressive behaviors  Primary Psychiatric Diagnoses   Aggressive behavior   Assessment   Patient was initially seen by a provider in the emergency department who recommended inpatient psychiatric admission for further monitoring and stabilization. On rounds today, patient reported doing good and stated I am trying to get out of here. Patient denied suicidal or homicidal ideation. He denied auditory or visual hallucinations.  When discussing the events that led to his presentation, patient reported that he threw a brick through the door because he was trying to get the phone and staff would not let him. Patient has documented intellectual developmental disorder, which significantly impairs his cognitive functioning, judgment, impulse control, problem-solving abilities, and reality testing. Individuals with intellectual developmental disorder often have difficulty understanding cause-and-effect relationships, anticipating consequences of their actions, managing frustration and emotional regulation, and utilizing  appropriate coping strategies when faced with obstacles or limitations. Patient's response to not being allowed access to the phone--destroying property by throwing a brick through a door--demonstrates significantly impaired judgment, poor impulse control, and lack of understanding of appropriate behavioral responses to frustration. This level of property destruction and potentially dangerous behavior (throwing objects that could harm staff or others) represents a safety concern that requires structured psychiatric intervention.  This provider is attempting to reach out to patient's legal guardian for further collateral information regarding patient's baseline functioning, current behavioral concerns, medication history, and any recent changes in mental status or behaviors that may have precipitated this acute presentation.  Psychiatry will continue to round on patient while he is holding in the emergency department awaiting appropriate inpatient psychiatric bed placement for further evaluation, behavioral stabilization, and safety monitoring.    Diagnoses:  Active Hospital problems: Active Problems:   Aggressive behavior    Plan   ## Psychiatric Medication Recommendations:  Continue home medications  ## Medical Decision Making Capacity: Patient has a guardian and has thus been adjudicated incompetent; please involve patients guardian in medical decision making  ## Further Work-up:    -- Pertinent labwork reviewed earlier this admission includes: CMP, CBC, urine drug screen, ethanol, valproic acid  level   ## Disposition:--Patient has been recommended for inpatient psychiatric admission  ## Behavioral / Environmental: -Utilize compassion and acknowledge the patient's experiences while setting clear and realistic expectations for care.    ## Safety and Observation Level:  - Based on my clinical evaluation, I estimate the patient to be at low risk of  self harm in the current setting. -  At this time, we recommend  routine. This decision is based on my review of the chart including patient's history and current presentation, interview of the patient, mental status examination, and consideration of suicide risk including evaluating suicidal ideation, plan, intent, suicidal or self-harm behaviors, risk factors, and protective factors. This judgment is based on our ability to directly address suicide risk, implement suicide prevention strategies, and develop a safety plan while the patient is in the clinical setting. Please contact our team if there is a concern that risk level has changed.  CSSR Risk Category:C-SSRS RISK CATEGORY: No Risk  Suicide Risk Assessment: Patient has following modifiable risk factors for suicide: triggering events, which we are addressing by utilizing therapeutic communication. Patient has following non-modifiable or demographic risk factors for suicide: male gender Patient has the following protective factors against suicide: Access to outpatient mental health care and no history of suicide attempts  Thank you for this consult request. Recommendations have been communicated to the primary team.  We will sign off at this time.   Zelda Sharps, NP        History of Present Illness  Relevant Aspects of Hospital ED   Selected initially by MD Madaram-please reference initial note    Psychiatric and Social History  Psychiatric History:  Selected initially by MD Madaram-please reference initial note  Exam Findings  Physical Exam: Reviewed and agree with the physical exam findings conducted by the medical provider Vital Signs:  Temp:  [97.8 F (36.6 C)-98.9 F (37.2 C)] 98.5 F (36.9 C) (01/05 0811) Pulse Rate:  [75-108] 91 (01/05 1134) Resp:  [16-17] 16 (01/05 1134) BP: (117-133)/(73-100) 133/89 (01/05 1134) SpO2:  [98 %-100 %] 98 % (01/05 1134) Blood pressure 133/89, pulse 91, temperature 98.5 F (36.9 C), temperature source Oral, resp. rate 16,  SpO2 98%. There is no height or weight on file to calculate BMI.        Other History   These have been pulled in through the EMR, reviewed, and updated if appropriate.  Family History:  The patient's family history is not on file.  Medical History: History reviewed. No pertinent past medical history.  Surgical History: History reviewed. No pertinent surgical history.   Medications:  Current Medications[1]  Allergies: Allergies[2]  Zelda Sharps, NP This note was created using Dragon dictation software. Please excuse any inadvertent transcription errors. Case was discussed with supervising physician Dr. Jadapalle who is agreeable with current plan.        [1]  Current Facility-Administered Medications:    clonazePAM  (KLONOPIN ) tablet 1 mg, 1 mg, Oral, BID, Fernand Rossie HERO, MD, 1 mg at 08/19/24 9048   divalproex  (DEPAKOTE  ER) 24 hr tablet 500 mg, 500 mg, Oral, QHS, Fernand Rossie HERO, MD, 500 mg at 08/18/24 2133   divalproex  (DEPAKOTE ) DR tablet 250 mg, 250 mg, Oral, BH-qamhs, Fernand Rossie HERO, MD, 250 mg at 08/19/24 9270   docusate sodium  (COLACE) capsule 100 mg, 100 mg, Oral, Daily, Fernand Rossie HERO, MD, 100 mg at 08/19/24 9048   fluvoxaMINE  (LUVOX ) tablet 50 mg, 50 mg, Oral, QPM, Fernand Rossie HERO, MD, 50 mg at 08/18/24 1818   hydrOXYzine  (ATARAX ) tablet 50 mg, 50 mg, Oral, QPM, Fernand Rossie HERO, MD, 50 mg at 08/18/24 1818   icosapent  Ethyl (VASCEPA ) 1 g capsule 2 g, 2 g, Oral, BID, Fernand Rossie HERO, MD, 2 g at 08/19/24 0950   levothyroxine  (SYNTHROID ) tablet 112 mcg, 112 mcg, Oral, Q0600, Fernand,  Rossie HERO, MD, 112 mcg at 08/19/24 9270   loratadine  (CLARITIN ) tablet 10 mg, 10 mg, Oral, Daily, Fernand Rossie HERO, MD, 10 mg at 08/19/24 0951   melatonin tablet 5 mg, 5 mg, Oral, QHS, Fernand Rossie HERO, MD, 5 mg at 08/18/24 2133   pantoprazole  (PROTONIX ) EC tablet 40 mg, 40 mg, Oral, Daily, Fernand Rossie HERO, MD, 40 mg at 08/19/24 9048   prazosin  (MINIPRESS ) capsule 2 mg, 2 mg,  Oral, QHS, Fernand Rossie HERO, MD, 2 mg at 08/18/24 2133   risperiDONE  (RISPERDAL ) tablet 1 mg, 1 mg, Oral, BID, Fernand Rossie HERO, MD, 1 mg at 08/19/24 9048  Current Outpatient Medications:    cetirizine (ZYRTEC) 10 MG tablet, Take 10 mg by mouth daily., Disp: , Rfl:    clonazePAM  (KLONOPIN ) 1 MG tablet, Take 1 mg by mouth 2 (two) times daily., Disp: , Rfl:    divalproex  (DEPAKOTE  ER) 500 MG 24 hr tablet, Take 500 mg by mouth at bedtime., Disp: , Rfl:    divalproex  (DEPAKOTE ) 250 MG DR tablet, Take 250 mg by mouth 2 (two) times daily in the am and at bedtime.., Disp: , Rfl:    docusate sodium  (COLACE) 100 MG capsule, Take 100 mg by mouth daily., Disp: , Rfl:    ferrous sulfate 325 (65 FE) MG EC tablet, Take 325 mg by mouth daily with breakfast., Disp: , Rfl:    fluvoxaMINE  (LUVOX ) 50 MG tablet, Take 50 mg by mouth every evening., Disp: , Rfl:    hydrOXYzine  (VISTARIL ) 50 MG capsule, Take 50 mg by mouth every evening., Disp: , Rfl:    icosapent  Ethyl (VASCEPA ) 1 g capsule, Take 2 g by mouth 2 (two) times daily., Disp: , Rfl:    levothyroxine  (SYNTHROID ) 112 MCG tablet, Take 112 mcg by mouth daily., Disp: , Rfl:    melatonin 3 MG TABS tablet, Take 3 mg by mouth at bedtime., Disp: , Rfl:    omeprazole (PRILOSEC) 40 MG capsule, Take 40 mg by mouth daily., Disp: , Rfl:    prazosin  (MINIPRESS ) 2 MG capsule, Take 2 mg by mouth at bedtime., Disp: , Rfl:    risperiDONE  (RISPERDAL ) 1 MG tablet, Take 1 mg by mouth 2 (two) times daily., Disp: , Rfl:  [2] Not on File

## 2024-08-19 NOTE — Progress Notes (Signed)
 Patient has been referred to the following facilities:   Service Provider Phone  [facility]  [phone]  [facility]  [phone]  [facility]  [phone]  [facility]  [phone]  [facility]  [phone]  [facility]  [phone]  [facility]  [phone]  [facility]  [phone]  [facility]  [phone]  [facility]  [phone]  [facility]  [phone]  [facility]  [phone]  [facility]  [phone]  [facility]  [phone]  [facility]  [phone]  [facility]  [phone]  [facility]      [place], [place] [phone]

## 2024-08-20 NOTE — ED Notes (Signed)
Patient in day room. 

## 2024-08-20 NOTE — ED Notes (Signed)
 Patient given dinner tray.

## 2024-08-20 NOTE — Progress Notes (Addendum)
 Darryl Hickman spoke with patient's legal guardian Darryl Hickman [phone]) to give an update on patient's current placement status.   Darryl Hickman verbalized understanding and stated that she would also discuss plan with patient's [facility] provider.        [facility] spoke to Darryl [phone] [facility] Administrator) and Darryl Hickman [phone] (QP) of Darryl Hickman Darryl Hickman.  Darryl Hickman gave an update on patient's current placement status and inquired if patient would be able to return to their facility after discharged. They both expressed safety concerns with patient returning and also noted that patient was not interested in returning to group [facility].  Darryl Hickman stated that the Administrators and herself would be having a staff meeting in the morning to determine if patient would be appropriate to return to their facility.  Darryl Hickman plans to give us  an update after their staff meeting tomorrow.    [place], [facility] [phone]

## 2024-08-20 NOTE — ED Notes (Signed)
 Pt given snack and beverage.

## 2024-08-20 NOTE — ED Notes (Signed)
 Pt given snack.

## 2024-08-20 NOTE — Consult Note (Signed)
 [facility] Psychiatric Consult Follow-up  Patient Name: .Darryl Hickman  MRN: [id number]  DOB: 23-Jul-1996  Consult Order details:  Orders (From admission, onward)     Start     Ordered   08/17/24 1313  CONSULT TO CALL ACT TEAM       Ordering Provider: Jossie Artist POUR, MD  Provider:  (Not yet assigned)  Question:  Reason for Consult?  Answer:  Psych consult   08/17/24 1312   08/17/24 1313  IP CONSULT TO PSYCHIATRY       Ordering Provider: Jossie Artist POUR, MD  Provider:  (Not yet assigned)  Question Answer Comment  Consult Timeframe ROUTINE - requires response within 24 hours   Reason for Consult? Consult for medication management   Contact phone number where the requesting provider can be reached [phone]      08/17/24 1312             Mode of Visit: In person    Psychiatry Consult Evaluation  Service Date: August 20, 2024 LOS:  LOS: 0 days  Chief Complaint Aggressive behaviors  Primary Psychiatric Diagnoses   Aggressive behavior   Assessment   Patient was initially seen by a provider in the emergency department who recommended inpatient psychiatric admission for further monitoring and stabilization. On rounds today, patient reported doing good and stated I am trying to get out of here. Patient denied suicidal or homicidal ideation. He denied auditory or visual hallucinations.  When discussing the events that led to his presentation, patient reported that he threw a brick through the door because he was trying to get the phone and staff would not let him. Patient has documented intellectual developmental disorder, which significantly impairs his cognitive functioning, judgment, impulse control, problem-solving abilities, and reality testing. Individuals with intellectual developmental disorder often have difficulty understanding cause-and-effect relationships, anticipating consequences of their actions, managing frustration and emotional regulation, and utilizing  appropriate coping strategies when faced with obstacles or limitations. Patient's response to not being allowed access to the phone--destroying property by throwing a brick through a door--demonstrates significantly impaired judgment, poor impulse control, and lack of understanding of appropriate behavioral responses to frustration. This level of property destruction and potentially dangerous behavior (throwing objects that could harm staff or others) represents a safety concern that requires structured psychiatric intervention.  This provider is attempting to reach out to patient's legal guardian for further collateral information regarding patient's baseline functioning, current behavioral concerns, medication history, and any recent changes in mental status or behaviors that may have precipitated this acute presentation.  Psychiatry will continue to round on patient while he is holding in the emergency department awaiting appropriate inpatient psychiatric bed placement for further evaluation, behavioral stabilization, and safety monitoring.  08/20/2024: Patient was seen on rounds today by psychiatry. Patient denied current suicidal or homicidal ideation. Patient reported doing good, I just want to go home. Patient denied auditory or visual hallucinations. He denied any noted side effects from his current medication regimen. Patient reported eating and sleeping okay at this time.  Per chart review and nursing staff report, patient has been compliant with his scheduled medications during his emergency department stay. Patient has not required any intramuscular agitation medications during his time in the emergency department, which is encouraging and demonstrates behavioral stability. Patient has maintained appropriate and safe behaviors with staff and other patients.  Of note, patient has a diagnosis of intellectual developmental disorder (IDD), which is significantly complicating inpatient psychiatric  admission placement. Patient has been  declined by multiple inpatient psychiatric facilities due to his IDD diagnosis. Many inpatient psychiatric units are not equipped to manage patients with significant developmental disabilities, as these patients often require specialized behavioral support, communication accommodations, modified treatment approaches, and staff trained in developmental disabilities care that acute psychiatric units may not have available. Additionally, patients with IDD may have difficulty participating in standard group therapy programming, psychoeducational groups, and milieu therapy activities that are core components of inpatient psychiatric treatment.  Psychiatry team will continue to round on patient while he remains in the emergency department. Given that patient is not being accepted to inpatient psychiatric facilities due to his IDD diagnosis, psychiatry will begin working collaboratively with the group home staff and patient's legal guardian to discuss alternative disposition plans.     Diagnoses:  Active Hospital problems: Active Problems:   Aggressive behavior    Plan   ## Psychiatric Medication Recommendations:  Continue home medications  ## Medical Decision Making Capacity: Patient has a guardian and has thus been adjudicated incompetent; please involve patients guardian in medical decision making  ## Further Work-up:    -- Pertinent labwork reviewed earlier this admission includes: CMP, CBC, urine drug screen, ethanol, valproic acid  level   ## Disposition:--Patient has been recommended for inpatient psychiatric admission  ## Behavioral / Environmental: -Utilize compassion and acknowledge the patient's experiences while setting clear and realistic expectations for care.    ## Safety and Observation Level:  - Based on my clinical evaluation, I estimate the patient to be at low risk of self harm in the current setting. - At this time, we recommend   routine. This decision is based on my review of the chart including patient's history and current presentation, interview of the patient, mental status examination, and consideration of suicide risk including evaluating suicidal ideation, plan, intent, suicidal or self-harm behaviors, risk factors, and protective factors. This judgment is based on our ability to directly address suicide risk, implement suicide prevention strategies, and develop a safety plan while the patient is in the clinical setting. Please contact our team if there is a concern that risk level has changed.  CSSR Risk Category:C-SSRS RISK CATEGORY: No Risk  Suicide Risk Assessment: Patient has following modifiable risk factors for suicide: triggering events, which we are addressing by utilizing therapeutic communication. Patient has following non-modifiable or demographic risk factors for suicide: male gender Patient has the following protective factors against suicide: Access to outpatient mental health care and no history of suicide attempts  Thank you for this consult request. Recommendations have been communicated to the primary team.  We will sign off at this time.   Zelda Sharps, NP        History of Present Illness  Relevant Aspects of Hospital ED   Selected initially by MD Madaram-please reference initial note    Psychiatric and Social History  Psychiatric History:  Selected initially by MD Madaram-please reference initial note  Exam Findings  Physical Exam: Reviewed and agree with the physical exam findings conducted by the medical provider Vital Signs:  Temp:  [97.8 F (36.6 C)-98 F (36.7 C)] 98 F (36.7 C) (01/06 0810) Pulse Rate:  [91-110] 99 (01/06 0810) Resp:  [16-18] 17 (01/06 0810) BP: (116-133)/(65-89) 116/65 (01/06 0810) SpO2:  [98 %-100 %] 100 % (01/06 0810) Blood pressure 116/65, pulse 99, temperature 98 F (36.7 C), temperature source Oral, resp. rate 17, SpO2 100%. There is no height or  weight on file to calculate BMI.  Other History   These have been pulled in through the EMR, reviewed, and updated if appropriate.  Family History:  The patient's family history is not on file.  Medical History: History reviewed. No pertinent past medical history.  Surgical History: History reviewed. No pertinent surgical history.   Medications:  Current Medications[1]  Allergies: Allergies[2]  Zelda Sharps, NP This note was created using Dragon dictation software. Please excuse any inadvertent transcription errors. Case was discussed with supervising physician Dr. Jadapalle who is agreeable with current plan.       [1]  Current Facility-Administered Medications:    clonazePAM  (KLONOPIN ) tablet 1 mg, 1 mg, Oral, BID, Fernand Rossie HERO, MD, 1 mg at 08/20/24 9058   divalproex  (DEPAKOTE  ER) 24 hr tablet 500 mg, 500 mg, Oral, QHS, Fernand Rossie HERO, MD, 500 mg at 08/19/24 2131   divalproex  (DEPAKOTE ) DR tablet 250 mg, 250 mg, Oral, BH-qamhs, Fernand Rossie HERO, MD, 250 mg at 08/20/24 0941   docusate sodium  (COLACE) capsule 100 mg, 100 mg, Oral, Daily, Fernand Rossie HERO, MD, 100 mg at 08/20/24 0941   fluvoxaMINE  (LUVOX ) tablet 50 mg, 50 mg, Oral, QPM, Fernand Rossie HERO, MD, 50 mg at 08/19/24 1717   hydrOXYzine  (ATARAX ) tablet 50 mg, 50 mg, Oral, QPM, Fernand Rossie HERO, MD, 50 mg at 08/19/24 1717   icosapent  Ethyl (VASCEPA ) 1 g capsule 2 g, 2 g, Oral, BID, Fernand Rossie HERO, MD, 2 g at 08/20/24 9057   levothyroxine  (SYNTHROID ) tablet 112 mcg, 112 mcg, Oral, Q0600, Fernand Rossie HERO, MD, 112 mcg at 08/20/24 9282   loratadine  (CLARITIN ) tablet 10 mg, 10 mg, Oral, Daily, Fernand Rossie HERO, MD, 10 mg at 08/20/24 0941   melatonin tablet 5 mg, 5 mg, Oral, QHS, Fernand Rossie HERO, MD, 5 mg at 08/19/24 2130   pantoprazole  (PROTONIX ) EC tablet 40 mg, 40 mg, Oral, Daily, Fernand Rossie HERO, MD, 40 mg at 08/20/24 0941   prazosin  (MINIPRESS ) capsule 2 mg, 2 mg, Oral, QHS, Fernand Rossie HERO, MD, 2 mg at 08/19/24  2130   risperiDONE  (RISPERDAL ) tablet 1 mg, 1 mg, Oral, BID, Fernand Rossie HERO, MD, 1 mg at 08/20/24 0941  Current Outpatient Medications:    cetirizine (ZYRTEC) 10 MG tablet, Take 10 mg by mouth daily., Disp: , Rfl:    clonazePAM  (KLONOPIN ) 1 MG tablet, Take 1 mg by mouth 2 (two) times daily., Disp: , Rfl:    divalproex  (DEPAKOTE  ER) 500 MG 24 hr tablet, Take 500 mg by mouth at bedtime., Disp: , Rfl:    divalproex  (DEPAKOTE ) 250 MG DR tablet, Take 250 mg by mouth 2 (two) times daily in the am and at bedtime.., Disp: , Rfl:    docusate sodium  (COLACE) 100 MG capsule, Take 100 mg by mouth daily., Disp: , Rfl:    ferrous sulfate 325 (65 FE) MG EC tablet, Take 325 mg by mouth daily with breakfast., Disp: , Rfl:    fluvoxaMINE  (LUVOX ) 50 MG tablet, Take 50 mg by mouth every evening., Disp: , Rfl:    hydrOXYzine  (VISTARIL ) 50 MG capsule, Take 50 mg by mouth every evening., Disp: , Rfl:    icosapent  Ethyl (VASCEPA ) 1 g capsule, Take 2 g by mouth 2 (two) times daily., Disp: , Rfl:    levothyroxine  (SYNTHROID ) 112 MCG tablet, Take 112 mcg by mouth daily., Disp: , Rfl:    melatonin 3 MG TABS tablet, Take 3 mg by mouth at bedtime., Disp: , Rfl:    omeprazole (PRILOSEC) 40 MG capsule, Take  40 mg by mouth daily., Disp: , Rfl:    prazosin  (MINIPRESS ) 2 MG capsule, Take 2 mg by mouth at bedtime., Disp: , Rfl:    risperiDONE  (RISPERDAL ) 1 MG tablet, Take 1 mg by mouth 2 (two) times daily., Disp: , Rfl:  [2] Not on File

## 2024-08-20 NOTE — ED Provider Notes (Signed)
" ° °  [facility] Observation Note   ----------------------------------------- 7:13 AM on 08/20/2024 -----------------------------------------  Darryl Hickman is a 29 y.o. male currently in the emergency department remains under IVC.  No acute events since last update.  Recent Vitals   Most recent vital signs: Vitals:   08/19/24 1638 08/19/24 2130  BP: 122/89 122/89  Pulse: (!) 110   Resp: 18   Temp: 97.8 F (36.6 C)   SpO2: 100%     ED Results / Procedures / Treatments   Labs (all labs ordered are listed, but only abnormal results are displayed) Labs Reviewed  CBC - Abnormal; Notable for the following components:      Result Value   Hemoglobin 12.8 (*)    HCT 38.8 (*)    Platelets 108 (*)    All other components within normal limits  URINE DRUG SCREEN - Abnormal; Notable for the following components:   Benzodiazepines POSITIVE (*)    All other components within normal limits  COMPREHENSIVE METABOLIC PANEL WITH GFR  ETHANOL  VALPROIC ACID  LEVEL    MEDICATIONS ORDERED IN ED: Medications  loratadine  (CLARITIN ) tablet 10 mg (10 mg Oral Given 08/19/24 0951)  divalproex  (DEPAKOTE  ER) 24 hr tablet 500 mg (500 mg Oral Given 08/19/24 2131)  divalproex  (DEPAKOTE ) DR tablet 250 mg (250 mg Oral Given 08/19/24 2130)  docusate sodium  (COLACE) capsule 100 mg (100 mg Oral Given 08/19/24 0951)  fluvoxaMINE  (LUVOX ) tablet 50 mg (50 mg Oral Given 08/19/24 1717)  hydrOXYzine  (ATARAX ) tablet 50 mg (50 mg Oral Given 08/19/24 1717)  icosapent  Ethyl (VASCEPA ) 1 g capsule 2 g (2 g Oral Given 08/19/24 2130)  levothyroxine  (SYNTHROID ) tablet 112 mcg (112 mcg Oral Given 08/19/24 0729)  melatonin tablet 5 mg (5 mg Oral Given 08/19/24 2130)  pantoprazole  (PROTONIX ) EC tablet 40 mg (40 mg Oral Given 08/19/24 0951)  prazosin  (MINIPRESS ) capsule 2 mg (2 mg Oral Given 08/19/24 2130)  risperiDONE  (RISPERDAL ) tablet 1 mg (1 mg Oral Given 08/19/24 2130)  clonazePAM  (KLONOPIN ) tablet 1 mg (1 mg Oral  Given 08/19/24 2130)  acetaminophen  (TYLENOL ) tablet 975 mg (975 mg Oral Given 08/19/24 2010)     ED Plan   Patient continues to be evaluated and monitored by psychiatry with a plan for either inpatient admission versus stabilization.  Medical workup has been largely nonrevealing.    Dorothyann Drivers, MD 08/20/24 [phone]  "

## 2024-08-20 NOTE — ED Notes (Signed)
Patient in assigned room.

## 2024-08-20 NOTE — ED Notes (Signed)
 Per pt request, this RN attempted to call Alexia Dyane, pt's legal guardian, but no answer. Will attempt to call again later.

## 2024-08-20 NOTE — ED Notes (Signed)
 This tech obtained vital signs on pt.

## 2024-08-20 NOTE — ED Notes (Signed)
 Meal provided

## 2024-08-20 NOTE — ED Notes (Signed)
 Patient sleeping

## 2024-08-20 NOTE — ED Notes (Signed)
 IVC/  PENDING  PLACEMENT

## 2024-08-20 NOTE — Progress Notes (Signed)
 Per TTS, patient has been referred to the following facilities:  Service Provider Phone  [facility]  [phone]  [facility]  [phone]  [facility]  [phone]  [facility]  [phone]  [facility]  [phone]  [facility]  [phone]  [facility]  [phone]  [facility]  [phone]  [facility]  [phone]  [facility]  [phone]  [facility]  [phone]  [facility]  [phone]  [facility]  [phone]  [facility]  [phone]  [facility]  [phone]  [facility]  [phone]  [facility]  [phone], [place] [phone]

## 2024-08-20 NOTE — ED Notes (Signed)
 Carrie from [facility] called to inquire about pt

## 2024-08-20 NOTE — ED Notes (Signed)
 ivc/consult done/recommended for inpatient psychiatric admission.

## 2024-08-20 NOTE — ED Notes (Signed)
 Patient sitting in dayroom. Patient requested phone. Patient informed that phone hours begin at 5PM.

## 2024-08-21 NOTE — Progress Notes (Addendum)
 [facility] followed up with Sharde Long (QP of group home).  [facility] staff met this morning to determine if patient would be appropriate to return to group home.  [facility] staff is aware of the current barriers to placement.  Ms. Darra stated [facility] staff and legal guardian agreed that pt would not be able to return to his group home due to safety concerns.  They believe pt is a danger to other residents at the group home and the staff.  For this reason, they plan to complete an immediate discharge.  The staff along with their Psych provider believes pt requires more care than they can provide.  They believe inpatient hospitalization is more appropriate at a facility that offers a longer length of stay than what our facility offer.  They mentioned that pt was previously at [facility] for a 30 day stay and that worked well for pt.  Pt has been denied for placement at [facility].  Ms. Darra stated that pt's LG plans to look into other placement with longer length of stay options.  [place], [facility] [phone]

## 2024-08-21 NOTE — ED Notes (Signed)
 Dinner tray provided to pt

## 2024-08-21 NOTE — ED Notes (Signed)
 Patient sleeping

## 2024-08-21 NOTE — ED Notes (Signed)
 Meal provided

## 2024-08-21 NOTE — ED Notes (Signed)
 Pt given snack.

## 2024-08-21 NOTE — ED Provider Notes (Signed)
----------------------------------------- °  7:39 AM on 08/21/2024 -----------------------------------------   Blood pressure 123/84, pulse 96, temperature 97.6 F (36.4 C), temperature source Oral, resp. rate 18, SpO2 100%.  The patient is calm and cooperative at this time.  There have been no acute events since the last update.  Awaiting disposition plan from Behavioral Medicine team and TTS.    Rexford Reche HERO, MD 08/21/24 [phone]

## 2024-08-21 NOTE — Consult Note (Signed)
 [facility] Psychiatric Consult Follow-up  Patient Name: .Darryl Hickman  MRN: [id number]  DOB: 1996-05-06  Consult Order details:  Orders (From admission, onward)     Start     Ordered   08/17/24 1313  CONSULT TO CALL ACT TEAM       Ordering Provider: Jossie Artist POUR, MD  Provider:  (Not yet assigned)  Question:  Reason for Consult?  Answer:  Psych consult   08/17/24 1312   08/17/24 1313  IP CONSULT TO PSYCHIATRY       Ordering Provider: Jossie Artist POUR, MD  Provider:  (Not yet assigned)  Question Answer Comment  Consult Timeframe ROUTINE - requires response within 24 hours   Reason for Consult? Consult for medication management   Contact phone number where the requesting provider can be reached [phone]      08/17/24 1312             Mode of Visit: In person    Psychiatry Consult Evaluation  Service Date: August 21, 2024 LOS:  LOS: 0 days  Chief Complaint Aggressive behaviors  Primary Psychiatric Diagnoses   Aggressive behavior   Assessment   Patient was initially seen by a provider in the emergency department who recommended inpatient psychiatric admission for further monitoring and stabilization. On rounds today, patient reported doing good and stated I am trying to get out of here. Patient denied suicidal or homicidal ideation. He denied auditory or visual hallucinations.  When discussing the events that led to his presentation, patient reported that he threw a brick through the door because he was trying to get the phone and staff would not let him. Patient has documented intellectual developmental disorder, which significantly impairs his cognitive functioning, judgment, impulse control, problem-solving abilities, and reality testing. Individuals with intellectual developmental disorder often have difficulty understanding cause-and-effect relationships, anticipating consequences of their actions, managing frustration and emotional regulation, and utilizing  appropriate coping strategies when faced with obstacles or limitations. Patient's response to not being allowed access to the phone--destroying property by throwing a brick through a door--demonstrates significantly impaired judgment, poor impulse control, and lack of understanding of appropriate behavioral responses to frustration. This level of property destruction and potentially dangerous behavior (throwing objects that could harm staff or others) represents a safety concern that requires structured psychiatric intervention.  This provider is attempting to reach out to patient's legal guardian for further collateral information regarding patient's baseline functioning, current behavioral concerns, medication history, and any recent changes in mental status or behaviors that may have precipitated this acute presentation.  Psychiatry will continue to round on patient while he is holding in the emergency department awaiting appropriate inpatient psychiatric bed placement for further evaluation, behavioral stabilization, and safety monitoring.  08/20/2024: Patient was seen on rounds today by psychiatry. Patient denied current suicidal or homicidal ideation. Patient reported doing good, I just want to go home. Patient denied auditory or visual hallucinations. He denied any noted side effects from his current medication regimen. Patient reported eating and sleeping okay at this time.  Per chart review and nursing staff report, patient has been compliant with his scheduled medications during his emergency department stay. Patient has not required any intramuscular agitation medications during his time in the emergency department, which is encouraging and demonstrates behavioral stability. Patient has maintained appropriate and safe behaviors with staff and other patients.  Of note, patient has a diagnosis of intellectual developmental disorder (IDD), which is significantly complicating inpatient psychiatric  admission placement. Patient has been  declined by multiple inpatient psychiatric facilities due to his IDD diagnosis. Many inpatient psychiatric units are not equipped to manage patients with significant developmental disabilities, as these patients often require specialized behavioral support, communication accommodations, modified treatment approaches, and staff trained in developmental disabilities care that acute psychiatric units may not have available. Additionally, patients with IDD may have difficulty participating in standard group therapy programming, psychoeducational groups, and milieu therapy activities that are core components of inpatient psychiatric treatment.  Psychiatry team will continue to round on patient while he remains in the emergency department. Given that patient is not being accepted to inpatient psychiatric facilities due to his IDD diagnosis, psychiatry will begin working collaboratively with the group home staff and patient's legal guardian to discuss alternative disposition plans.   08/21/2024: Patient was seen on rounds today by psychiatry. Patient continues to deny suicidal or homicidal ideation. He denied auditory or visual hallucinations. Patient reported doing well with his current medication regimen and denied any noted side effects. Patient reported sleeping and eating okay while in the emergency department.  Patient has documented history of intellectual developmental disorder (IDD), which continues to be a significant barrier to inpatient psychiatric facility acceptance. Patient has been holding in the emergency department for 4 days while awaiting inpatient psychiatric placement. However, multiple facilities have declined accepting patient due to his IDD diagnosis, as many inpatient psychiatric units are not equipped to accommodate patients with significant developmental disabilities.  Notably, per nursing report and chart review, patient has maintained safe behaviors  throughout his 4-day stay in the emergency department without requiring intramuscular agitation medications, physical interventions, or intensive behavioral management. Patient has been cooperative with staff, compliant with medications, and has not displayed the aggressive or dangerous behaviors that precipitated his initial involuntary commitment and emergency department presentation.  Behavioral health coordinators have been actively involved and have reached out to the group home and patient's legal guardians regarding disposition planning and coordination of care upon discharge. Please reference behavioral health coordinator notes for detailed documentation of these discussions and arrangements.  Given patient's sustained behavioral stability over 4 days in the emergency department, denial of suicidal and homicidal ideation, absence of psychotic symptoms, compliance with medication regimen, and lack of acceptance to inpatient psychiatric facilities despite multiple referral attempts, psychiatry plans to rescind patient's involuntary commitment tomorrow if safe behaviors continue to be maintained. Patient no longer appears to meet criteria for continued involuntary commitment, as he is not presenting as an imminent danger to himself or others at this time.  However, if the group home does not allow patient to return to their facility, Transition [facility]) consultation may be warranted to assist with identifying alternative placement options that can accommodate patient's psychiatric and developmental needs in the community.  Psychiatry will continue to round on patient while he remains in the emergency department and will reassess involuntary commitment status tomorrow pending continued behavioral stability and clarification of discharge disposition with group home.    Diagnoses:  Active Hospital problems: Active Problems:   Aggressive behavior    Plan   ## Psychiatric Medication  Recommendations:  Continue home medications  ## Medical Decision Making Capacity: Patient has a guardian and has thus been adjudicated incompetent; please involve patients guardian in medical decision making  ## Further Work-up:    -- Pertinent labwork reviewed earlier this admission includes: CMP, CBC, urine drug screen, ethanol, valproic acid  level   ## Disposition:--Patient has been recommended for inpatient psychiatric admission  ## Behavioral / Environmental: -  Utilize compassion and acknowledge the patient's experiences while setting clear and realistic expectations for care.    ## Safety and Observation Level:  - Based on my clinical evaluation, I estimate the patient to be at low risk of self harm in the current setting. - At this time, we recommend  routine. This decision is based on my review of the chart including patient's history and current presentation, interview of the patient, mental status examination, and consideration of suicide risk including evaluating suicidal ideation, plan, intent, suicidal or self-harm behaviors, risk factors, and protective factors. This judgment is based on our ability to directly address suicide risk, implement suicide prevention strategies, and develop a safety plan while the patient is in the clinical setting. Please contact our team if there is a concern that risk level has changed.  CSSR Risk Category:C-SSRS RISK CATEGORY: No Risk  Suicide Risk Assessment: Patient has following modifiable risk factors for suicide: triggering events, which we are addressing by utilizing therapeutic communication. Patient has following non-modifiable or demographic risk factors for suicide: male gender Patient has the following protective factors against suicide: Access to outpatient mental health care and no history of suicide attempts  Thank you for this consult request. Recommendations have been communicated to the primary team.  We will sign off at this  time.   Zelda Sharps, NP        History of Present Illness  Relevant Aspects of Hospital ED   Selected initially by MD Madaram-please reference initial note    Psychiatric and Social History  Psychiatric History:  Selected initially by MD Madaram-please reference initial note  Exam Findings  Physical Exam: Reviewed and agree with the physical exam findings conducted by the medical provider Vital Signs:  Temp:  [97.6 F (36.4 C)-98.6 F (37 C)] 98 F (36.7 C) (01/07 0729) Pulse Rate:  [90-103] 90 (01/07 0729) Resp:  [16-18] 16 (01/07 0729) BP: (116-129)/(76-90) 116/76 (01/07 0729) SpO2:  [100 %] 100 % (01/07 0729) Blood pressure 116/76, pulse 90, temperature 98 F (36.7 C), temperature source Oral, resp. rate 16, SpO2 100%. There is no height or weight on file to calculate BMI.        Other History   These have been pulled in through the EMR, reviewed, and updated if appropriate.  Family History:  The patient's family history is not on file.  Medical History: History reviewed. No pertinent past medical history.  Surgical History: History reviewed. No pertinent surgical history.   Medications:  Current Medications[1]  Allergies: Allergies[2]  Zelda Sharps, NP This note was created using Dragon dictation software. Please excuse any inadvertent transcription errors. Case was discussed with supervising physician Dr. Jadapalle who is agreeable with current plan.       [1]  Current Facility-Administered Medications:    clonazePAM  (KLONOPIN ) tablet 1 mg, 1 mg, Oral, BID, Fernand Rossie HERO, MD, 1 mg at 08/21/24 9041   divalproex  (DEPAKOTE  ER) 24 hr tablet 500 mg, 500 mg, Oral, QHS, Fernand Rossie HERO, MD, 500 mg at 08/20/24 2118   divalproex  (DEPAKOTE ) DR tablet 250 mg, 250 mg, Oral, BH-qamhs, Fernand Rossie HERO, MD, 250 mg at 08/21/24 9041   docusate sodium  (COLACE) capsule 100 mg, 100 mg, Oral, Daily, Fernand Rossie HERO, MD, 100 mg at 08/21/24 9041   fluvoxaMINE  (LUVOX )  tablet 50 mg, 50 mg, Oral, QPM, Fernand Rossie HERO, MD, 50 mg at 08/20/24 1806   hydrOXYzine  (ATARAX ) tablet 50 mg, 50 mg, Oral, QPM, Fernand Rossie HERO, MD, 50 mg  at 08/20/24 1806   icosapent  Ethyl (VASCEPA ) 1 g capsule 2 g, 2 g, Oral, BID, Fernand Rossie HERO, MD, 2 g at 08/21/24 1008   levothyroxine  (SYNTHROID ) tablet 112 mcg, 112 mcg, Oral, Q0600, Fernand Rossie HERO, MD, 112 mcg at 08/21/24 9471   loratadine  (CLARITIN ) tablet 10 mg, 10 mg, Oral, Daily, Fernand Rossie HERO, MD, 10 mg at 08/21/24 9041   melatonin tablet 5 mg, 5 mg, Oral, QHS, Fernand Rossie HERO, MD, 5 mg at 08/20/24 2118   pantoprazole  (PROTONIX ) EC tablet 40 mg, 40 mg, Oral, Daily, Fernand Rossie HERO, MD, 40 mg at 08/21/24 9041   prazosin  (MINIPRESS ) capsule 2 mg, 2 mg, Oral, QHS, Fernand Rossie HERO, MD, 2 mg at 08/20/24 2119   risperiDONE  (RISPERDAL ) tablet 1 mg, 1 mg, Oral, BID, Fernand Rossie HERO, MD, 1 mg at 08/21/24 9041  Current Outpatient Medications:    cetirizine (ZYRTEC) 10 MG tablet, Take 10 mg by mouth daily., Disp: , Rfl:    clonazePAM  (KLONOPIN ) 1 MG tablet, Take 1 mg by mouth 2 (two) times daily., Disp: , Rfl:    divalproex  (DEPAKOTE  ER) 500 MG 24 hr tablet, Take 500 mg by mouth at bedtime., Disp: , Rfl:    divalproex  (DEPAKOTE ) 250 MG DR tablet, Take 250 mg by mouth 2 (two) times daily in the am and at bedtime.., Disp: , Rfl:    docusate sodium  (COLACE) 100 MG capsule, Take 100 mg by mouth daily., Disp: , Rfl:    ferrous sulfate 325 (65 FE) MG EC tablet, Take 325 mg by mouth daily with breakfast., Disp: , Rfl:    fluvoxaMINE  (LUVOX ) 50 MG tablet, Take 50 mg by mouth every evening., Disp: , Rfl:    hydrOXYzine  (VISTARIL ) 50 MG capsule, Take 50 mg by mouth every evening., Disp: , Rfl:    icosapent  Ethyl (VASCEPA ) 1 g capsule, Take 2 g by mouth 2 (two) times daily., Disp: , Rfl:    levothyroxine  (SYNTHROID ) 112 MCG tablet, Take 112 mcg by mouth daily., Disp: , Rfl:    melatonin 3 MG TABS tablet, Take 3 mg by mouth at bedtime., Disp: , Rfl:     omeprazole (PRILOSEC) 40 MG capsule, Take 40 mg by mouth daily., Disp: , Rfl:    prazosin  (MINIPRESS ) 2 MG capsule, Take 2 mg by mouth at bedtime., Disp: , Rfl:    risperiDONE  (RISPERDAL ) 1 MG tablet, Take 1 mg by mouth 2 (two) times daily., Disp: , Rfl:  [2] Not on File

## 2024-08-21 NOTE — ED Notes (Signed)
Snacks given to pt.

## 2024-08-22 MED ORDER — RISPERIDONE 0.25 MG PO TABS: 0.5000 mg | ORAL_TABLET | Freq: Two times a day (BID) | ORAL | Status: DC | PRN

## 2024-08-22 NOTE — ED Notes (Signed)
 IVC\ Recom psych inpt
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
## 2024-08-22 NOTE — ED Provider Notes (Signed)
 Emergency Medicine Observation Re-evaluation Note  Darryl Hickman is a 29 y.o. male, seen on rounds today.  Pt initially presented to the ED for complaints of IVC Currently, the patient is eating.  Physical Exam  BP 102/62   Pulse 98   Temp 97.7 F (36.5 C) (Oral)   Resp 19   SpO2 99%  Physical Exam General: NAD Cardiac: WWP Lungs: No distress Psych: NAD  ED Course / MDM  EKG:   I have reviewed the labs performed to date as well as medications administered while in observation.  Recent changes in the last 24 hours include None.  Plan  Current plan is for psychiatry to see the patient, possibly rescind the IVC, possibly return to group home versus new placement.     Nicholaus Rolland BRAVO, MD 08/22/24 [phone]
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
## 2024-08-22 NOTE — TOC Initial Note (Addendum)
 Transition of Care [facility]) - Initial/Assessment Note    Patient Details  Name: Darryl Hickman MRN: [id number] Date of Birth: [birth year]  Transition of Care [facility]) CM/SW Contact:    [name], LCSW Phone Number: 08/22/2024, 2:29 PM  Clinical Narrative:                  [facility] consult received. Patient has been cleared from psych and is medically ready to return to group home. Consult states patient can not return to group home.   CSW spoke with the legal guardian from [facility], [name], who advised that per her last conversation, patient was able to return to his group home. [name] requested an FL2. FL2 completed and sent to LG. LG stated that she was advised that a placement can be found quickly.   CSW made it clear that patient can not wait in the ED for a placement to become available. She expressed understanding and advised that the group home told her that patient can return until a placement is located.   2:44pm: CSW spoke with [name], Group Home Administrator. [name] advised that patients behaviors prevent him from returning. [name] requested that patients meds are adjusted. She stated that she will follow-up with his QP provider to determine what is needed. She stated that he needs something to manage his behaviors.   Secure chat sent requesting that the psych provider contact Group Home and discuss medication management.   Chat updated. Risperidone  .5mg  added to patients medication regimine. CSW contacted the LG to update CSW advised that the Group Home stated that this is what is needed in order to discharge. There is no reason at this time to refuse discharge at this standpoint.   3:44pm: Call received from the LG. [name] willing to take patient back to the group home tomorrow at 9am. Unfortunately the facility does not have transportation tonight. Treatment team sticky note updated to reflect this information.        Patient Goals and CMS Choice            Expected Discharge  Plan and Services                                              Prior Living Arrangements/Services                       Activities of Daily Living      Permission Sought/Granted                  Emotional Assessment              Admission diagnosis:  IVC Patient Active Problem List   Diagnosis Date Noted   Aggressive behavior 08/19/2024   PCP:  [name], NP Pharmacy:   [name], [name] - [address] [place] [address] Phone: [phone] Fax: [phone]     Social Drivers of Health (SDOH) Social History: SDOH Screenings   Tobacco Use: Low Risk (08/17/2024)   SDOH Interventions:     Readmission Risk Interventions     No data to display

## 2024-08-22 NOTE — ED Notes (Signed)
 Meal provided

## 2024-08-22 NOTE — ED Notes (Signed)
 Vol /moved to [employer]

## 2024-08-22 NOTE — ED Notes (Signed)
 IVC PAPERS  RESCINDED  PER  DR  JADAPALLE  MD  INFORMED GRACE  RN
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
## 2024-08-22 NOTE — ED Notes (Signed)
 VOL  PENDING  PLACEMENT  MOVED TO  RM 20

## 2024-08-22 NOTE — ED Notes (Signed)
 Dinner tray provided to pt

## 2024-08-23 ENCOUNTER — Encounter: Payer: Self-pay | Admitting: Psychiatry

## 2024-08-23 NOTE — Discharge Instructions (Addendum)
 Please follow-up with your primary care provider and psychiatry provider for reevaluation.  Continue your medications as already prescribed.  Return to the emergency department with any new or worsening symptoms.

## 2024-08-23 NOTE — ED Notes (Signed)
Pt given shower supplies. Pt showered independently.

## 2024-08-23 NOTE — ED Notes (Signed)
 Breakfast tray provided to pt.

## 2024-08-23 NOTE — ED Provider Notes (Addendum)
 Emergency Medicine Observation Re-evaluation Note  Darryl Hickman is a 29 y.o. male, seen on rounds today.  Pt initially presented to the ED for complaints of IVC Currently, the patient is resting in no acute distress.  Physical Exam  BP 112/85 (BP Location: Left Arm)   Pulse 95   Temp 98.4 F (36.9 C) (Oral)   Resp 18   SpO2 100%  Physical Exam General: Resting in no acute distress Cardiac: Appears well-perfused Lungs: Normal work of breathing Psych: Resting, calm  ED Course / MDM  EKG:   I have reviewed the labs performed to date as well as medications administered while in observation.  Recent changes in the last 24 hours include psychiatry dropping IVC.  Plan  Current plan is for disposition per TOC.  Already psychiatrically cleared, IVC has been discontinued.  TOC has arranged safe disposition.  Discharge per TOC/social work recommendation after medical and psychiatric clearance.  UPDATE: Social work spoke with patient's group home.  They will pick up patient.  Will proceed with discharge, has already been psychiatrically and medically cleared.      Clarine Ozell LABOR, MD 08/23/24 [phone]

## 2024-08-23 NOTE — ED Notes (Signed)
VOL/Pending Placement 

## 2024-08-23 NOTE — ED Notes (Signed)
 Legal guardian, Hardin Hint, notified of patient's d/c back to group home.
